# Patient Record
Sex: Female | Born: 1990 | Race: Black or African American | Marital: Single | Smoking: Never smoker
Health system: Southern US, Community
[De-identification: ages and names within clinical notes are randomized; demographics above are authoritative.]

## PROBLEM LIST (undated history)

## (undated) DIAGNOSIS — Z789 Other specified health status: Secondary | ICD-10-CM

## (undated) HISTORY — PX: WISDOM TOOTH EXTRACTION: SHX21

---

## 2019-02-08 HISTORY — PX: LIPOSUCTION: SHX10

## 2020-02-08 NOTE — L&D Delivery Note (Addendum)
Delivery Summary for [facility]  Labor Events:   Preterm labor: No data found  Rupture date: 01/09/2021  Rupture time: 10:37 AM  Rupture type: Artificial Intact  Fluid Color: Light Meconium  Induction: No data found  Augmentation: No data found  Complications: No data found  Cervical ripening: No data found No data found   No data found     Delivery:   Episiotomy: No data found  Lacerations: No data found  Repair suture: No data found  Repair # of packets: No data found  Blood loss (ml): 790   Information for the patient's newborn:  Chai, Verdejo [[id number]]   Delivery 01/09/2021 2:47 PM by  Vaginal, Spontaneous Sex:  female Gestational Age: [redacted]w[redacted]d Delivery Clinician:   Living?:         APGARS  One minute Five minutes Ten minutes  Skin color:        Heart rate:        Grimace:        Muscle tone:        Breathing:        Totals: 9  9      Presentation/position:      Resuscitation:   Cord information:    Disposition of cord blood:     Blood gases sent?  Complications:   Placenta: Delivered:       appearance Newborn Measurements: Weight: 6 lb 13 oz (3090 g)  Height: 20.47"  Head circumference:    Chest circumference:    Other providers:    Additional  information: Forceps:   Vacuum:   Breech:   Observed anomalies       Delivery Note At 2:47 PM a viable and healthy female "Colon Branch" was delivered via Vaginal, Spontaneous (Compound Presentation: Left Occiput Anterior).  APGAR: 9, 9; weight 6 lb 13 oz (3090 g).   Placenta status: Spontaneous, Intact.  Cord: 3 vessels with the following complications: None.  Cord pH: not obtained.  Delayed cord clamping observed.   Anesthesia: Epidural Episiotomy: None Lacerations: 1st degree perineal, hemostatic Suture Repair:  None Est. Blood Loss (mL):  790.  800 mcg of Cytotec administered rectally.  Mom to postpartum.  Baby to Couplet care / Skin to Skin.   Hildred Laser, MD 01/09/2021, 3:08 PM

## 2020-05-07 ENCOUNTER — Encounter: Payer: Self-pay | Admitting: Obstetrics and Gynecology

## 2020-05-14 ENCOUNTER — Ambulatory Visit (INDEPENDENT_AMBULATORY_CARE_PROVIDER_SITE_OTHER): Payer: BC Managed Care – PPO | Admitting: Obstetrics and Gynecology

## 2020-05-14 ENCOUNTER — Encounter: Payer: Self-pay | Admitting: Obstetrics and Gynecology

## 2020-05-14 ENCOUNTER — Other Ambulatory Visit: Payer: Self-pay

## 2020-05-14 VITALS — BP 113/80 | HR 90 | Ht 64.0 in | Wt 199.4 lb

## 2020-05-14 DIAGNOSIS — Z3200 Encounter for pregnancy test, result unknown: Secondary | ICD-10-CM | POA: Diagnosis not present

## 2020-05-14 DIAGNOSIS — N912 Amenorrhea, unspecified: Secondary | ICD-10-CM | POA: Diagnosis not present

## 2020-05-14 LAB — POCT URINE PREGNANCY: Preg Test, Ur: POSITIVE — AB

## 2020-05-14 NOTE — Progress Notes (Signed)
HPI:      [name] is a 30 y.o. G1P0 who LMP was Patient's last menstrual period was 04/10/2020.  Subjective:   She presents today because she has missed a menses and believes she may be pregnant.  She has done home pregnancy test which were positive.  Based on LMP she is approximately 4 weeks estimated gestational age.  She was not specifically preventing pregnancy but not attempted either.  She is currently taking prenatal vitamins.  She has some nausea without vomiting. She plans to breast-feed this baby. She is fully vaccinated and boosted for Covid    Hx: The following portions of the patient's history were reviewed and updated as appropriate:             She  has no past medical history on file. She does not have a problem list on file. She  has a past surgical history that includes Liposuction (2021). Her family history includes Diabetes in her maternal grandfather; Hypertension in her mother. She  reports that she has never smoked. She has never used smokeless tobacco. She reports previous alcohol use. She reports current drug use. Drug: Marijuana. She has a current medication list which includes the following prescription(s): multivitamin-prenatal. She has No Known Allergies.       Review of Systems:  Review of Systems  Constitutional: Denied constitutional symptoms, night sweats, recent illness, fatigue, fever, insomnia and weight loss.  Eyes: Denied eye symptoms, eye pain, photophobia, vision change and visual disturbance.  Ears/Nose/Throat/Neck: Denied ear, nose, throat or neck symptoms, hearing loss, nasal discharge, sinus congestion and sore throat.  Cardiovascular: Denied cardiovascular symptoms, arrhythmia, chest pain/pressure, edema, exercise intolerance, orthopnea and palpitations.  Respiratory: Denied pulmonary symptoms, asthma, pleuritic pain, productive sputum, cough, dyspnea and wheezing.  Gastrointestinal: Denied, gastro-esophageal reflux, melena, nausea  and vomiting.  Genitourinary: Denied genitourinary symptoms including symptomatic vaginal discharge, pelvic relaxation issues, and urinary complaints.  Musculoskeletal: Denied musculoskeletal symptoms, stiffness, swelling, muscle weakness and myalgia.  Dermatologic: Denied dermatology symptoms, rash and scar.  Neurologic: Denied neurology symptoms, dizziness, headache, neck pain and syncope.  Psychiatric: Denied psychiatric symptoms, anxiety and depression.  Endocrine: Denied endocrine symptoms including hot flashes and night sweats.   Meds:   Current Outpatient Medications on File Prior to Visit  Medication Sig Dispense Refill  . Prenatal Vit-Fe Fumarate-FA (MULTIVITAMIN-PRENATAL) 27-0.8 MG TABS tablet Take 1 tablet by mouth daily at 12 noon.     No current facility-administered medications on file prior to visit.          Objective:     Vitals:   05/14/20 1115  BP: 113/80  Pulse: 90   Filed Weights   05/14/20 1115  Weight: 199 lb 6.4 oz (90.4 kg)              Urinary beta-hCG positive  Assessment:    G1P0 There are no problems to display for this patient.    1. Possible pregnancy, not confirmed   2. Amenorrhea        Plan:            Prenatal Plan 1.  The patient was given prenatal literature. 2.  She was continued on prenatal vitamins. 3.  A prenatal lab panel to be drawn at nurse visit. 4.  An ultrasound was ordered to better determine an EDC. 5.  A nurse visit was scheduled. 6.  Genetic testing and testing for other inheritable conditions discussed in detail. She will decide in the future whether to  have these labs performed. 7.  A general overview of pregnancy testing, visit schedule, ultrasound schedule, and prenatal care was discussed. 8.  COVID and its risks associated with pregnancy, prevention by limiting exposure and use of masks, as well as the risks and benefits of vaccination during pregnancy were discussed in detail.  [facility] policy regarding  office and hospital visitation and testing was explained. 9.  Benefits of breast-feeding discussed in detail including both maternal and infant benefits. Ready Set Baby website discussed.   Orders Orders Placed This Encounter  Procedures  . US OB Comp Less 14 Wks    No orders of the defined types were placed in this encounter.     F/U  Return in about 8 weeks (around 07/09/2020). I spent 33 minutes involved in the care of this patient preparing to see the patient by obtaining and reviewing her medical history (including labs, imaging tests and prior procedures), documenting clinical information in the electronic health record (EHR), counseling and coordinating care plans, writing and sending prescriptions, ordering tests or procedures and directly communicating with the patient by discussing pertinent items from her history and physical exam as well as detailing my assessment and plan as noted above so that she has an informed understanding.  All of her questions were answered.  Elonda Husky, M.D. 05/14/2020 11:51 AM

## 2020-06-19 ENCOUNTER — Ambulatory Visit (INDEPENDENT_AMBULATORY_CARE_PROVIDER_SITE_OTHER): Payer: BC Managed Care – PPO | Admitting: Surgical

## 2020-06-19 ENCOUNTER — Other Ambulatory Visit: Payer: Self-pay

## 2020-06-19 VITALS — BP 123/84 | HR 73 | Ht 64.0 in | Wt 204.3 lb

## 2020-06-19 DIAGNOSIS — Z3491 Encounter for supervision of normal pregnancy, unspecified, first trimester: Secondary | ICD-10-CM

## 2020-06-19 LAB — OB RESULTS CONSOLE GC/CHLAMYDIA: Gonorrhea: NEGATIVE

## 2020-06-19 LAB — OB RESULTS CONSOLE VARICELLA ZOSTER ANTIBODY, IGG: Varicella: IMMUNE

## 2020-06-19 NOTE — Progress Notes (Signed)
Amy Oconnor presents for NOB nurse interview visit. Pregnancy confirmation done 05/14/2020. G1. P0. Pregnancy education material explained and given. 0 cats in home. NOB labs ordered. TSH/HbgA1c ordered due to BMI 30 or greater. Sickle cell ordered due to patient's race. HIV labs and drug screen were explained and ordered. PNV encouraged. Genetic screening options discussed. Genetic testing: Unsure. Patient may discuss with the provider. Patient to follow up with provider on 07/09/2020 for NOB physical.   FMLA and drug screen forms signed. All questions answered for patient. Patient has had Covid vaccine and Booster. She will bring card next visit so we can document.

## 2020-06-19 NOTE — Patient Instructions (Signed)
Common Medications Safe in Pregnancy  Acne:      Constipation:  Benzoyl Peroxide     Colace  Clindamycin      Dulcolax Suppository  Topica Erythromycin     Fibercon  Salicylic Acid      Metamucil         Miralax AVOID:        Senakot   Accutane    Cough:  Retin-A       Cough Drops  Tetracycline      Phenergan w/ Codeine if Rx  Minocycline      Robitussin (Plain & DM)  Antibiotics:     Crabs/Lice:  Ceclor       RID  Cephalosporins    AVOID:  E-Mycins      Kwell  Keflex  Macrobid/Macrodantin   Diarrhea:  Penicillin      Kao-Pectate  Zithromax      Imodium AD         PUSH FLUIDS AVOID:       Cipro     Fever:  Tetracycline      Tylenol (Regular or Extra  Minocycline       Strength)  Levaquin      Extra Strength-Do not          Exceed 8 tabs/24 hrs Caffeine:        <200mg/day (equiv. To 1 cup of coffee or  approx. 3 12 oz sodas)         Gas: Cold/Hayfever:       Gas-X  Benadryl      Mylicon  Claritin       Phazyme  **Claritin-D        Chlor-Trimeton    Headaches:  Dimetapp      ASA-Free Excedrin  Drixoral-Non-Drowsy     Cold Compress  Mucinex (Guaifenasin)     Tylenol (Regular or Extra  Sudafed/Sudafed-12 Hour     Strength)  **Sudafed PE Pseudoephedrine   Tylenol Cold & Sinus     Vicks Vapor Rub  Zyrtec  **AVOID if Problems With Blood Pressure         Heartburn: Avoid lying down for at least 1 hour after meals  Aciphex      Maalox     Rash:  Milk of Magnesia     Benadryl    Mylanta       1% Hydrocortisone Cream  Pepcid  Pepcid Complete   Sleep Aids:  Prevacid      Ambien   Prilosec       Benadryl  Rolaids       Chamomile Tea  Tums (Limit 4/day)     Unisom         Tylenol PM         Warm milk-add vanilla or  Hemorrhoids:       Sugar for taste  Anusol/Anusol H.C.  (RX: Analapram 2.5%)  Sugar Substitutes:  Hydrocortisone OTC     Ok in moderation  Preparation H      Tucks        Vaseline lotion applied to tissue with  wiping    Herpes:     Throat:  Acyclovir      Oragel  Famvir  Valtrex     Vaccines:         Flu Shot Leg Cramps:       *Gardasil  Benadryl      Hepatitis A         Hepatitis B Nasal Spray:         Pneumovax  Saline Nasal Spray     Polio Booster         Tetanus Nausea:       Tuberculosis test or PPD  Vitamin B6 25 mg TID   AVOID:    Dramamine      *Gardasil  Emetrol       Live Poliovirus  Ginger Root 250 mg QID    MMR (measles, mumps &  High Complex Carbs @ Bedtime    rebella)  Sea Bands-Accupressure    Varicella (Chickenpox)  Unisom 1/2 tab TID     *No known complications           If received before Pain:         Known pregnancy;   Darvocet       Resume series after  Lortab        Delivery  Percocet    Yeast:   Tramadol      Femstat  Tylenol 3      Gyne-lotrimin  Ultram       Monistat  Vicodin           MISC:         All Sunscreens           Hair Coloring/highlights          Insect Repellant's          (Including DEET)         Mystic Tans    https://www.cdc.gov/pregnancy/infections.html">  First Trimester of Pregnancy  The first trimester of pregnancy starts on the first day of your last menstrual period until the end of week 12. This is also called months 1 through 3 of pregnancy. Body changes during your first trimester Your body goes through many changes during pregnancy. The changes usually return to normal after your baby is born. Physical changes  You may gain or lose weight.  Your breasts may grow larger and hurt. The area around your nipples may get darker.  Dark spots or blotches may develop on your face.  You may have changes in your hair. Health changes  You may feel like you might vomit (nauseous), and you may vomit.  You may have heartburn.  You may have headaches.  You may have trouble pooping (constipation).  Your gums may bleed. Other changes  You may get tired easily.  You may pee (urinate) more often.  Your menstrual periods will  stop.  You may not feel hungry.  You may want to eat certain kinds of food.  You may have changes in your emotions from day to day.  You may have more dreams. Follow these instructions at home: Medicines  Take over-the-counter and prescription medicines only as told by your doctor. Some medicines are not safe during pregnancy.  Take a prenatal vitamin that contains at least 600 micrograms (mcg) of folic acid. Eating and drinking  Eat healthy meals that include: ? Fresh fruits and vegetables. ? Whole grains. ? Good sources of protein, such as meat, eggs, or tofu. ? Low-fat dairy products.  Avoid raw meat and unpasteurized juice, milk, and cheese.  If you feel like you may vomit, or you vomit: ? Eat 4 or 5 small meals a day instead of 3 large meals. ? Try eating a few soda crackers. ? Drink liquids between meals instead of during meals.  You may need to take these actions to prevent or treat trouble pooping: ? Drink enough fluids to keep your pee (urine) pale yellow. ?   Eat foods that are high in fiber. These include beans, whole grains, and fresh fruits and vegetables. ? Limit foods that are high in fat and sugar. These include fried or sweet foods. Activity  Exercise only as told by your doctor. Most people can do their usual exercise routine during pregnancy.  Stop exercising if you have cramps or pain in your lower belly (abdomen) or low back.  Do not exercise if it is too hot or too humid, or if you are in a place of great height (high altitude).  Avoid heavy lifting.  If you choose to, you may have sex unless your doctor tells you not to. Relieving pain and discomfort  Wear a good support bra if your breasts are sore.  Rest with your legs raised (elevated) if you have leg cramps or low back pain.  If you have bulging veins (varicose veins) in your legs: ? Wear support hose as told by your doctor. ? Raise your feet for 15 minutes, 3-4 times a day. ? Limit salt  in your food. Safety  Wear your seat belt at all times when you are in a car.  Talk with your doctor if someone is hurting you or yelling at you.  Talk with your doctor if you are feeling sad or have thoughts of hurting yourself. Lifestyle  Do not use hot tubs, steam rooms, or saunas.  Do not douche. Do not use tampons or scented sanitary pads.  Do not use herbal medicines, illegal drugs, or medicines that are not approved by your doctor. Do not drink alcohol.  Do not smoke or use any products that contain nicotine or tobacco. If you need help quitting, ask your doctor.  Avoid cat litter boxes and soil that is used by cats. These carry germs that can cause harm to the baby and can cause a loss of your baby by miscarriage or stillbirth. General instructions  Keep all follow-up visits. This is important.  Ask for help if you need counseling or if you need help with nutrition. Your doctor can give you advice or tell you where to go for help.  Visit your dentist. At home, brush your teeth with a soft toothbrush. Floss gently.  Write down your questions. Take them to your prenatal visits. Where to find more information  American Pregnancy Association: americanpregnancy.org  American College of Obstetricians and Gynecologists: www.acog.org  Office on Women's Health: womenshealth.gov/pregnancy Contact a doctor if:  You are dizzy.  You have a fever.  You have mild cramps or pressure in your lower belly.  You have a nagging pain in your belly area.  You continue to feel like you may vomit, you vomit, or you have watery poop (diarrhea) for 24 hours or longer.  You have a bad-smelling fluid coming from your vagina.  You have pain when you pee.  You are exposed to a disease that spreads from person to person, such as chickenpox, measles, Zika virus, HIV, or hepatitis. Get help right away if:  You have spotting or bleeding from your vagina.  You have very bad belly cramping  or pain.  You have shortness of breath or chest pain.  You have any kind of injury, such as from a fall or a car crash.  You have new or increased pain, swelling, or redness in an arm or leg. Summary  The first trimester of pregnancy starts on the first day of your last menstrual period until the end of week 12 (months 1 through   3).  Eat 4 or 5 small meals a day instead of 3 large meals.  Do not smoke or use any products that contain nicotine or tobacco. If you need help quitting, ask your doctor.  Keep all follow-up visits. This information is not intended to replace advice given to you by your health care provider. Make sure you discuss any questions you have with your health care provider. Document Revised: 07/03/2019 Document Reviewed: 05/09/2019 Elsevier Patient Education  2021 Elsevier Inc.  

## 2020-06-20 LAB — MICROSCOPIC EXAMINATION
Casts: NONE SEEN /lpf
Epithelial Cells (non renal): >10 /hpf — AB (ref 0–10)

## 2020-06-20 LAB — URINALYSIS, ROUTINE W REFLEX MICROSCOPIC
Bilirubin, UA: NEGATIVE
Glucose, UA: NEGATIVE
Nitrite, UA: NEGATIVE
RBC, UA: NEGATIVE
Specific Gravity, UA: 1.022 (ref 1.005–1.030)
Urobilinogen, Ur: 0.2 mg/dL (ref 0.2–1.0)
pH, UA: 7 (ref 5.0–7.5)

## 2020-06-21 LAB — CULTURE, OB URINE

## 2020-06-21 LAB — GC/CHLAMYDIA PROBE AMP
Chlamydia trachomatis, NAA: NEGATIVE
Neisseria Gonorrhoeae by PCR: NEGATIVE

## 2020-06-21 LAB — URINE CULTURE, OB REFLEX

## 2020-06-22 ENCOUNTER — Ambulatory Visit: Payer: BC Managed Care – PPO

## 2020-06-23 LAB — CBC WITH DIFFERENTIAL/PLATELET
Basophils Absolute: 0 10*3/uL (ref 0.0–0.2)
Basos: 1 %
EOS (ABSOLUTE): 0.1 10*3/uL (ref 0.0–0.4)
Eos: 1 %
Hematocrit: 38.5 % (ref 34.0–46.6)
Hemoglobin: 12.7 g/dL (ref 11.1–15.9)
Immature Grans (Abs): 0 10*3/uL (ref 0.0–0.1)
Immature Granulocytes: 0 %
Lymphocytes Absolute: 1.8 10*3/uL (ref 0.7–3.1)
Lymphs: 36 %
MCH: 28.3 pg (ref 26.6–33.0)
MCHC: 33 g/dL (ref 31.5–35.7)
MCV: 86 fL (ref 79–97)
Monocytes Absolute: 0.3 10*3/uL (ref 0.1–0.9)
Monocytes: 6 %
Neutrophils Absolute: 2.7 10*3/uL (ref 1.4–7.0)
Neutrophils: 56 %
Platelets: 264 10*3/uL (ref 150–450)
RBC: 4.49 x10E6/uL (ref 3.77–5.28)
RDW: 13.8 % (ref 11.7–15.4)
WBC: 4.9 10*3/uL (ref 3.4–10.8)

## 2020-06-23 LAB — VIRAL HEPATITIS HBV, HCV
HCV Ab: <0.1 s/co ratio (ref 0.0–0.9)
Hep B Core Total Ab: NEGATIVE
Hep B Surface Ab, Qual: NONREACTIVE
Hepatitis B Surface Ag: NEGATIVE

## 2020-06-23 LAB — MONITOR DRUG PROFILE 14(MW)
Amphetamine Scrn, Ur: NEGATIVE ng/mL
BARBITURATE SCREEN URINE: NEGATIVE ng/mL
BENZODIAZEPINE SCREEN, URINE: NEGATIVE ng/mL
Buprenorphine, Urine: NEGATIVE ng/mL
CANNABINOIDS UR QL SCN: NEGATIVE ng/mL
Cocaine (Metab) Scrn, Ur: NEGATIVE ng/mL
Creatinine(Crt), U: 219.9 mg/dL (ref 20.0–300.0)
Fentanyl, Urine: NEGATIVE pg/mL
Meperidine Screen, Urine: NEGATIVE ng/mL
Methadone Screen, Urine: NEGATIVE ng/mL
OXYCODONE+OXYMORPHONE UR QL SCN: NEGATIVE ng/mL
Opiate Scrn, Ur: NEGATIVE ng/mL
Ph of Urine: 7.1 (ref 4.5–8.9)
Phencyclidine Qn, Ur: NEGATIVE ng/mL
Propoxyphene Scrn, Ur: NEGATIVE ng/mL
SPECIFIC GRAVITY: 1.02
Tramadol Screen, Urine: NEGATIVE ng/mL

## 2020-06-23 LAB — HEMOGLOBIN A1C
Est. average glucose Bld gHb Est-mCnc: 114 mg/dL
Hgb A1c MFr Bld: 5.6 % (ref 4.8–5.6)

## 2020-06-23 LAB — RUBELLA SCREEN: Rubella Antibodies, IGG: 1.3 index (ref >0.99)

## 2020-06-23 LAB — HIV ANTIBODY (ROUTINE TESTING W REFLEX): HIV Screen 4th Generation wRfx: NONREACTIVE

## 2020-06-23 LAB — RPR: RPR Ser Ql: NONREACTIVE

## 2020-06-23 LAB — TSH: TSH: 1.19 u[IU]/mL (ref 0.450–4.500)

## 2020-06-23 LAB — ABO AND RH: Rh Factor: POSITIVE

## 2020-06-23 LAB — VARICELLA ZOSTER ANTIBODY, IGG: Varicella zoster IgG: 3372 index (ref >165)

## 2020-06-23 LAB — ANTIBODY SCREEN: Antibody Screen: NEGATIVE

## 2020-06-23 LAB — HCV INTERPRETATION

## 2020-06-23 LAB — HGB SOLU + RFLX FRAC: Sickle Solubility Test - HGBRFX: NEGATIVE

## 2020-06-24 LAB — MATERNIT21  PLUS CORE+ESS+SCA, BLOOD

## 2020-06-29 ENCOUNTER — Ambulatory Visit: Admission: RE | Admit: 2020-06-29 | Payer: BC Managed Care – PPO | Source: Ambulatory Visit

## 2020-07-08 ENCOUNTER — Ambulatory Visit: Payer: BC Managed Care – PPO

## 2020-07-08 ENCOUNTER — Other Ambulatory Visit: Payer: Self-pay

## 2020-07-08 ENCOUNTER — Ambulatory Visit
Admission: RE | Admit: 2020-07-08 | Discharge: 2020-07-08 | Disposition: A | Discharge status: Home / Self Care | Payer: BC Managed Care – PPO | Source: Ambulatory Visit | Attending: Obstetrics and Gynecology | Admitting: Obstetrics and Gynecology

## 2020-07-08 ENCOUNTER — Other Ambulatory Visit: Payer: Self-pay | Admitting: Obstetrics and Gynecology

## 2020-07-08 DIAGNOSIS — Z3200 Encounter for pregnancy test, result unknown: Secondary | ICD-10-CM | POA: Diagnosis not present

## 2020-07-08 DIAGNOSIS — N912 Amenorrhea, unspecified: Secondary | ICD-10-CM

## 2020-07-09 ENCOUNTER — Other Ambulatory Visit: Payer: Self-pay

## 2020-07-09 ENCOUNTER — Encounter: Payer: Self-pay | Admitting: Obstetrics and Gynecology

## 2020-07-09 ENCOUNTER — Ambulatory Visit (INDEPENDENT_AMBULATORY_CARE_PROVIDER_SITE_OTHER): Payer: BC Managed Care – PPO | Admitting: Obstetrics and Gynecology

## 2020-07-09 VITALS — BP 146/80 | HR 81 | Wt 205.3 lb

## 2020-07-09 DIAGNOSIS — Z3402 Encounter for supervision of normal first pregnancy, second trimester: Secondary | ICD-10-CM

## 2020-07-09 DIAGNOSIS — Z3A14 14 weeks gestation of pregnancy: Secondary | ICD-10-CM

## 2020-07-09 LAB — POCT URINALYSIS DIPSTICK OB
Bilirubin, UA: NEGATIVE
Glucose, UA: NEGATIVE
Ketones, UA: NEGATIVE
Nitrite, UA: NEGATIVE
POC,PROTEIN,UA: NEGATIVE
Spec Grav, UA: <=1.005 — AB (ref 1.010–1.025)
Urobilinogen, UA: 0.2 E.U./dL
pH, UA: 7.5 (ref 5.0–8.0)

## 2020-07-09 NOTE — Progress Notes (Signed)
NOB: No complaints.  Taking vitamins as directed.  Normal MaterniT 21.  aFP next visit.  Physical examination General NAD, Conversant  HEENT Atraumatic; Op clear with mmm.  Normo-cephalic. Pupils reactive. Anicteric sclerae  Thyroid/Neck Smooth without nodularity or enlargement. Normal ROM.  Neck Supple.  Skin No rashes, lesions or ulceration. Normal palpated skin turgor. No nodularity.  Breasts: No masses or discharge.  Symmetric.  No axillary adenopathy.  Lungs: Clear to auscultation.No rales or wheezes. Normal Respiratory effort, no retractions.  Heart: NSR.  No murmurs or rubs appreciated. No periferal edema  Abdomen: Soft.  Non-tender.  No masses.  No HSM. No hernia  Extremities: Moves all appropriately.  Normal ROM for age. No lymphadenopathy.  Neuro: Oriented to PPT.  Normal mood. Normal affect.     Pelvic:   Vulva: Normal appearance.  No lesions.  Vagina: No lesions or abnormalities noted.  Support: Normal pelvic support.  Urethra No masses tenderness or scarring.  Meatus Normal size without lesions or prolapse.  Cervix: Normal appearance.  No lesions.  Anus: Normal exam.  No lesions.  Perineum: Normal exam.  No lesions.        Bimanual   Adnexae: No masses.  Non-tender to palpation.  Uterus: Enlarged. 14wks POS FHTs  Non-tender.  Mobile.  AV.  Adnexae: No masses.  Non-tender to palpation.  Cul-de-sac: Negative for abnormality.  Adnexae: No masses.  Non-tender to palpation.         Pelvimetry   Diagonal: Reached.  Spines: Average.  Sacrum: Concave.  Pubic Arch: Normal.

## 2020-07-29 NOTE — Progress Notes (Signed)
OB-Pt present for routine prenatal care. Pt stated that she is doing well.  

## 2020-07-29 NOTE — Patient Instructions (Signed)
WHAT OB PATIENTS CAN EXPECT  Confirmation of pregnancy and ultrasound ordered if medically indicated-[redacted] weeks gestation New OB (NOB) intake with nurse and New OB (NOB) labs- [redacted] weeks gestation New OB (NOB) physical examination with provider- 11/[redacted] weeks gestation Flu vaccine-[redacted] weeks gestation Anatomy scan-[redacted] weeks gestation Glucose tolerance test, blood work to test for anemia, T-dap vaccine-[redacted] weeks gestation Vaginal swabs/cultures-STD/Group B strep-[redacted] weeks gestation Appointments every 4 weeks until 28 weeks Every 2 weeks from 28 weeks until 36 weeks Weekly visits from 36 weeks until delivery  Common Medications Safe in Pregnancy  Acne:      Constipation:  Benzoyl Peroxide     Colace  Clindamycin      Dulcolax Suppository  Topica Erythromycin     Fibercon  Salicylic Acid      Metamucil         Miralax AVOID:        Senakot   Accutane    Cough:  Retin-A       Cough Drops  Tetracycline      Phenergan w/ Codeine if Rx  Minocycline      Robitussin (Plain & DM)  Antibiotics:     Crabs/Lice:  Ceclor       RID  Cephalosporins    AVOID:  E-Mycins      Kwell  Keflex  Macrobid/Macrodantin   Diarrhea:  Penicillin      Kao-Pectate  Zithromax      Imodium AD         PUSH FLUIDS AVOID:       Cipro     Fever:  Tetracycline      Tylenol (Regular or Extra  Minocycline       Strength)  Levaquin      Extra Strength-Do not          Exceed 8 tabs/24 hrs Caffeine:        <200mg/day (equiv. To 1 cup of coffee or  approx. 3 12 oz sodas)         Gas: Cold/Hayfever:       Gas-X  Benadryl      Mylicon  Claritin       Phazyme  **Claritin-D        Chlor-Trimeton    Headaches:  Dimetapp      ASA-Free Excedrin  Drixoral-Non-Drowsy     Cold Compress  Mucinex (Guaifenasin)     Tylenol (Regular or Extra  Sudafed/Sudafed-12 Hour     Strength)  **Sudafed PE Pseudoephedrine   Tylenol Cold & Sinus     Vicks Vapor Rub  Zyrtec  **AVOID if Problems With Blood Pressure         Heartburn:  Avoid lying down for at least 1 hour after meals  Aciphex      Maalox     Rash:  Milk of Magnesia     Benadryl    Mylanta       1% Hydrocortisone Cream  Pepcid  Pepcid Complete   Sleep Aids:  Prevacid      Ambien   Prilosec       Benadryl  Rolaids       Chamomile Tea  Tums (Limit 4/day)     Unisom         Tylenol PM         Warm milk-add vanilla or  Hemorrhoids:       Sugar for taste  Anusol/Anusol H.C.  (RX: Analapram 2.5%)  Sugar Substitutes:  Hydrocortisone OTC       Ok in moderation  Preparation H      Tucks        Vaseline lotion applied to tissue with wiping    Herpes:     Throat:  Acyclovir      Oragel  Famvir  Valtrex     Vaccines:         Flu Shot Leg Cramps:       *Gardasil  Benadryl      Hepatitis A         Hepatitis B Nasal Spray:       Pneumovax  Saline Nasal Spray     Polio Booster         Tetanus Nausea:       Tuberculosis test or PPD  Vitamin B6 25 mg TID   AVOID:    Dramamine      *Gardasil  Emetrol       Live Poliovirus  Ginger Root 250 mg QID    MMR (measles, mumps &  High Complex Carbs @ Bedtime    rebella)  Sea Bands-Accupressure    Varicella (Chickenpox)  Unisom 1/2 tab TID     *No known complications           If received before Pain:         Known pregnancy;   Darvocet       Resume series after  Lortab        Delivery  Percocet    Yeast:   Tramadol      Femstat  Tylenol 3      Gyne-lotrimin  Ultram       Monistat  Vicodin           MISC:         All Sunscreens           Hair Coloring/highlights          Insect Repellant's          (Including DEET)         Mystic Tans  

## 2020-07-30 ENCOUNTER — Encounter: Payer: Self-pay | Admitting: Obstetrics and Gynecology

## 2020-07-30 ENCOUNTER — Other Ambulatory Visit: Payer: Self-pay

## 2020-07-30 ENCOUNTER — Ambulatory Visit (INDEPENDENT_AMBULATORY_CARE_PROVIDER_SITE_OTHER): Payer: BC Managed Care – PPO | Admitting: Obstetrics and Gynecology

## 2020-07-30 VITALS — BP 114/76 | HR 88 | Wt 210.4 lb

## 2020-07-30 DIAGNOSIS — Z1379 Encounter for other screening for genetic and chromosomal anomalies: Secondary | ICD-10-CM

## 2020-07-30 DIAGNOSIS — Z3402 Encounter for supervision of normal first pregnancy, second trimester: Secondary | ICD-10-CM

## 2020-07-30 DIAGNOSIS — Z3A17 17 weeks gestation of pregnancy: Secondary | ICD-10-CM

## 2020-07-30 LAB — POCT URINALYSIS DIPSTICK OB
Bilirubin, UA: NEGATIVE
Blood, UA: NEGATIVE
Glucose, UA: NEGATIVE
Ketones, UA: NEGATIVE
Leukocytes, UA: NEGATIVE
Nitrite, UA: NEGATIVE
POC,PROTEIN,UA: NEGATIVE
Spec Grav, UA: <=1.005 — AB (ref 1.010–1.025)
Urobilinogen, UA: 0.2 E.U./dL
pH, UA: 8 (ref 5.0–8.0)

## 2020-07-30 NOTE — Progress Notes (Addendum)
ROB: Doing well, no complaints.  Has anatomy scan scheduled. Normal MaterniT21, for AFP today. Plans to breastfeed. Elevated BP noted last visit but normal today.  Will continue to monitor during the pregnancy. RTC in 4 weeks.

## 2020-08-07 LAB — AFP, SERUM, OPEN SPINA BIFIDA
AFP MoM: 1.6
AFP Value: 56.9 ng/mL
Gest. Age on Collection Date: 17 weeks
Maternal Age At EDD: 28.8 yr
OSBR Risk 1 IN: 4200
Test Results:: NEGATIVE
Weight: 210 [lb_av]

## 2020-08-13 ENCOUNTER — Other Ambulatory Visit: Payer: Self-pay

## 2020-08-13 ENCOUNTER — Ambulatory Visit
Admission: RE | Admit: 2020-08-13 | Discharge: 2020-08-13 | Disposition: A | Discharge status: Home / Self Care | Payer: BC Managed Care – PPO | Source: Ambulatory Visit | Attending: Obstetrics and Gynecology | Admitting: Obstetrics and Gynecology

## 2020-08-13 DIAGNOSIS — Z369 Encounter for antenatal screening, unspecified: Secondary | ICD-10-CM | POA: Diagnosis not present

## 2020-08-13 DIAGNOSIS — Z3A14 14 weeks gestation of pregnancy: Secondary | ICD-10-CM

## 2020-08-27 ENCOUNTER — Ambulatory Visit (INDEPENDENT_AMBULATORY_CARE_PROVIDER_SITE_OTHER): Payer: BC Managed Care – PPO | Admitting: Obstetrics and Gynecology

## 2020-08-27 ENCOUNTER — Other Ambulatory Visit: Payer: Self-pay

## 2020-08-27 ENCOUNTER — Encounter: Payer: Self-pay | Admitting: Obstetrics and Gynecology

## 2020-08-27 VITALS — BP 115/75 | HR 78 | Wt 213.4 lb

## 2020-08-27 DIAGNOSIS — Z3A21 21 weeks gestation of pregnancy: Secondary | ICD-10-CM

## 2020-08-27 DIAGNOSIS — Z3402 Encounter for supervision of normal first pregnancy, second trimester: Secondary | ICD-10-CM

## 2020-08-27 NOTE — Progress Notes (Signed)
ROB: No complaints.  Patient now feeling the baby move.  Note for work-no prolonged standing, more frequent seated breaks.  Ultrasound results discussed.

## 2020-08-31 ENCOUNTER — Telehealth: Payer: Self-pay | Admitting: Obstetrics and Gynecology

## 2020-08-31 NOTE — Telephone Encounter (Signed)
Patient called in and states she sent the following message per mychart:  I spoke with my safety department at work. They said they needed something more quantitative. Are you able to adjust the letter to say that I need a twenty minute rest break after standing for a duration of 2hrs or something more along those lines? Whatever you think is suitable to go a long with provided time for meal breaks.   Thank you.  Please advise.

## 2020-08-31 NOTE — Telephone Encounter (Signed)
LVM with patient informing her work restriction note has been sent and should be in her MyChart and to call with any other questions.

## 2020-08-31 NOTE — Telephone Encounter (Signed)
Amy Oconnor states when she was at her last appointment on 7/21, she was supposed to get a work note sent to her mychart.  Patient states she never received it.  Patient states she needs a work note stating she cannot stand for prolonged periods of time and that she needs to be allowed to get appropriate times off for meals.  Patient states she works for [employer].  Patient states she needs this note today if at all possible because she is working for the next 3 days.  Please advise.

## 2020-08-31 NOTE — Telephone Encounter (Signed)
Is it ok to adjust letter. Please advise.

## 2020-09-03 NOTE — Telephone Encounter (Signed)
Thanks. Adjustments have been made.

## 2020-10-01 ENCOUNTER — Ambulatory Visit (INDEPENDENT_AMBULATORY_CARE_PROVIDER_SITE_OTHER): Payer: BC Managed Care – PPO | Admitting: Obstetrics and Gynecology

## 2020-10-01 ENCOUNTER — Other Ambulatory Visit: Payer: Self-pay

## 2020-10-01 ENCOUNTER — Encounter: Payer: Self-pay | Admitting: Obstetrics and Gynecology

## 2020-10-01 VITALS — BP 120/81 | HR 85 | Wt 218.9 lb

## 2020-10-01 DIAGNOSIS — Z3A26 26 weeks gestation of pregnancy: Secondary | ICD-10-CM

## 2020-10-01 DIAGNOSIS — Z3402 Encounter for supervision of normal first pregnancy, second trimester: Secondary | ICD-10-CM

## 2020-10-01 LAB — POCT URINALYSIS DIPSTICK OB
Glucose, UA: NEGATIVE
Ketones, UA: NEGATIVE
Leukocytes, UA: NEGATIVE
Nitrite, UA: NEGATIVE
POC,PROTEIN,UA: NEGATIVE
Spec Grav, UA: 1.01 (ref 1.010–1.025)
Urobilinogen, UA: 0.2 E.U./dL
pH, UA: 8.5 — AB (ref 5.0–8.0)

## 2020-10-01 NOTE — Patient Instructions (Addendum)
DOULA SERVICES    Please email [place] at: [email or web address]. We do have a phone number, but it is a voicemail only, [phone]. We can then call the client back and collect info from them to ensure they qualify for no-cost doula services and match them with a doula.   You may also complete a Request Form online.  Please view the Doula Request Form ( [email or web address]) and submit it if you meet the qualifying criteria listed within the form. If you do not meet the criteria for the program to receive a doula at no cost, we have attached a list of private doulas who are credentialed with [facility] that you can connect with and retain for your birth.   Currently, our program is only for birth, but most of our doulas connect with clients and provide support in the prenatal period as well.     GESTATIONAL DIABETES TESTING FOR PREGNANCY  Pregnant women can develop a condition known as Gestational Diabetes (diabetes brought on by pregnancy) which can pose a risk to both mother and baby. A glucose tolerance test is a common type of testing for potential gestational diabetes.  There are several tests intended to identify gestational diabetes in pregnant women. The first, called the Glucose Challenge Screening, is a preliminary screening test performed between 26-28 weeks. If a woman tests positive during this screening test, the second test, called the Glucose Tolerance Test, may be performed. This test will diagnose whether diabetes exists or not by indicating whether or not the body is using glucose (a type of sugar) effectively.  The Glucose Challenge Screening is now considered to be a standard test performed during the early part of the third trimester of pregnancy.  What is the Glucose Challenge Screening Test? No preparation is required prior to the test. During the test, the mother is asked to drink a sweet liquid (glucose) and then will have blood drawn one hour  from having the drink, as blood glucose levels normally peak within one hour. No fasting is required prior to this test.  The test evaluates how your body processes sugar. A high level in your blood may indicate your body is not processing sugar effectively (positive test). If the results of this screen are positive, the woman may have the Glucose Tolerance Test performed. It is important to note that not all women who test positive for the Glucose Challenge Screening test are found to have diabetes upon further diagnosis.  What is the Glucose Tolerance Test? Prior to the taking the glucose tolerance test, your doctor will ask you to make sure and eat at least 112m of carbohydrates (about what you will get from a slice or two of bread) for three days prior to the time you will be asked to fast. You will not be permitted to eat or drink anything but sips of water for 14 hours prior to the test, so it is best to schedule the test for first thing in the morning.  Additionally, you should plan to have someone drive you to and from the test since your energy levels may be low and there is a slight possibility you may feel light-headed.  When you arrive, the technician will draw blood to measure your baseline "fasting blood glucose level". You will be asked to drink a larger volume (or more concentrated solution) of the glucose drink than was used in the initial Glucose Challenge Screening test. Your blood will be drawn and tested every hour for the next  three hours.  The following are the values that the American Diabetes Association considers to be abnormal during the Glucose Tolerance Test:  Interval Abnormal reading Fasting 95 mg/dl or higher One hour 180 mg/dl or higher Two hours 155 mg/dl or higher Three hours 140 mg/dl or higher  What if my Glucose Tolerance Test Results are Abnormal? If only one of your readings comes back abnormal, your doctor may suggest some changes to your diet and/or test  you again later in the pregnancy. If two or more of your readings come back abnormal, you'll be diagnosed with Gestational Diabetes and your doctor or midwife will talk to you about a treatment plan. Treating diabetes during pregnancy is extremely important to protect the health of both mother and baby.   Compiled using information from the following sources:  1. American Diabetes Association  https://www.diabetes.org  2. Emedicine  https://www.emedicine.com  3. [employer] of Diabetes and Digestive and Kidney Diseases     Common Medications Safe in Pregnancy  Acne:      Constipation:  Benzoyl Peroxide     Colace  Clindamycin      Dulcolax Suppository  Topica Erythromycin     Fibercon  Salicylic Acid      Metamucil         Miralax AVOID:        Senakot   Accutane    Cough:  Retin-A       Cough Drops  Tetracycline      Phenergan w/ Codeine if Rx  Minocycline      Robitussin (Plain & DM)  Antibiotics:     Crabs/Lice:  Ceclor       RID  Cephalosporins    AVOID:  E-Mycins      Kwell  Keflex  Macrobid/Macrodantin   Diarrhea:  Penicillin      Kao-Pectate  Zithromax      Imodium AD         PUSH FLUIDS AVOID:       Cipro     Fever:  Tetracycline      Tylenol (Regular or Extra  Minocycline       Strength)  Levaquin      Extra Strength-Do not          Exceed 8 tabs/24 hrs Caffeine:        <246m/day (equiv. To 1 cup of coffee or  approx. 3 12 oz sodas)         Gas: Cold/Hayfever:       Gas-X  Benadryl      Mylicon  Claritin       Phazyme  **Claritin-D        Chlor-Trimeton    Headaches:  Dimetapp      ASA-Free Excedrin  Drixoral-Non-Drowsy     Cold Compress  Mucinex (Guaifenasin)     Tylenol (Regular or Extra  Sudafed/Sudafed-12 Hour     Strength)  **Sudafed PE Pseudoephedrine   Tylenol Cold & Sinus     Vicks Vapor Rub  Zyrtec  **AVOID if Problems With Blood Pressure         Heartburn: Avoid lying down for at least 1 hour after  meals  Aciphex      Maalox     Rash:  Milk of Magnesia     Benadryl    Mylanta       1% Hydrocortisone Cream  Pepcid  Pepcid Complete   Sleep Aids:  Prevacid      Ambien   Prilosec  Benadryl  Rolaids       Chamomile Tea  Tums (Limit 4/day)     Unisom         Tylenol PM         Warm milk-add vanilla or  Hemorrhoids:       Sugar for taste  Anusol/Anusol H.C.  (RX: Analapram 2.5%)  Sugar Substitutes:  Hydrocortisone OTC     Ok in moderation  Preparation H      Tucks        Vaseline lotion applied to tissue with wiping    Herpes:     Throat:  Acyclovir      Oragel  Famvir  Valtrex     Vaccines:         Flu Shot Leg Cramps:       *Gardasil  Benadryl      Hepatitis A         Hepatitis B Nasal Spray:       Pneumovax  Saline Nasal Spray     Polio Booster         Tetanus Nausea:       Tuberculosis test or PPD  Vitamin B6 25 mg TID   AVOID:    Dramamine      *Gardasil  Emetrol       Live Poliovirus  Ginger Root 250 mg QID    MMR (measles, mumps &  High Complex Carbs @ Bedtime    rebella)  Sea Bands-Accupressure    Varicella (Chickenpox)  Unisom 1/2 tab TID     *No known complications           If received before Pain:         Known pregnancy;   Darvocet       Resume series after  Lortab        Delivery  Percocet    Yeast:   Tramadol      Femstat  Tylenol 3      Gyne-lotrimin  Ultram       Monistat  Vicodin           MISC:         All Sunscreens           Hair Coloring/highlights          Insect Repellant's          (Including DEET)         Mystic Tans

## 2020-10-01 NOTE — Progress Notes (Signed)
ROB: She has been having some discomfort that comes and goes when she urinates.

## 2020-10-01 NOTE — Progress Notes (Signed)
Reports issues with congestion since last Wednesday. Mucus changed from clear to green. Has been taking Benadryl which helped. Also encouraged use of Mucinex. Discussed labor plans, unsure of desires at this time. Notes that she has a family member who is a doula and plans to utilize at her birth. Informed of credentialing process through [facility].  RTC 2 weeks, for glucola at that time.

## 2020-10-14 ENCOUNTER — Other Ambulatory Visit: Payer: Self-pay

## 2020-10-14 DIAGNOSIS — Z3A27 27 weeks gestation of pregnancy: Secondary | ICD-10-CM

## 2020-10-14 DIAGNOSIS — Z3482 Encounter for supervision of other normal pregnancy, second trimester: Secondary | ICD-10-CM

## 2020-10-15 ENCOUNTER — Other Ambulatory Visit: Payer: Self-pay

## 2020-10-15 ENCOUNTER — Ambulatory Visit (INDEPENDENT_AMBULATORY_CARE_PROVIDER_SITE_OTHER): Payer: BC Managed Care – PPO | Admitting: Obstetrics and Gynecology

## 2020-10-15 ENCOUNTER — Other Ambulatory Visit: Payer: BC Managed Care – PPO

## 2020-10-15 ENCOUNTER — Encounter: Payer: Self-pay | Admitting: Obstetrics and Gynecology

## 2020-10-15 VITALS — BP 118/83 | HR 76 | Wt 222.4 lb

## 2020-10-15 DIAGNOSIS — Z23 Encounter for immunization: Secondary | ICD-10-CM | POA: Diagnosis not present

## 2020-10-15 DIAGNOSIS — Z3482 Encounter for supervision of other normal pregnancy, second trimester: Secondary | ICD-10-CM

## 2020-10-15 DIAGNOSIS — Z3A28 28 weeks gestation of pregnancy: Secondary | ICD-10-CM | POA: Diagnosis not present

## 2020-10-15 DIAGNOSIS — Z3402 Encounter for supervision of normal first pregnancy, second trimester: Secondary | ICD-10-CM | POA: Diagnosis not present

## 2020-10-15 LAB — POCT URINALYSIS DIPSTICK OB
Bilirubin, UA: NEGATIVE
Blood, UA: NEGATIVE
Glucose, UA: NEGATIVE
Ketones, UA: NEGATIVE
Leukocytes, UA: NEGATIVE
Nitrite, UA: NEGATIVE
POC,PROTEIN,UA: NEGATIVE
Spec Grav, UA: 1.01 (ref 1.010–1.025)
Urobilinogen, UA: 0.2 E.U./dL
pH, UA: 8 (ref 5.0–8.0)

## 2020-10-15 NOTE — Progress Notes (Signed)
ROB: Denies problems.  Patient doing very well.  To begin light duty at work. (Letter given).  GCT today.  No longer any issues with congestion.

## 2020-10-15 NOTE — Progress Notes (Signed)
ROB: She is doing well, no concerns. Pregnancy has been really smooth so far. TDAP/ BTC/ CBC/ RPR and Glucose done today.

## 2020-10-16 LAB — CBC
Hematocrit: 33.8 % — ABNORMAL LOW (ref 34.0–46.6)
Hemoglobin: 11.1 g/dL (ref 11.1–15.9)
MCH: 28 pg (ref 26.6–33.0)
MCHC: 32.8 g/dL (ref 31.5–35.7)
MCV: 85 fL (ref 79–97)
Platelets: 238 10*3/uL (ref 150–450)
RBC: 3.96 x10E6/uL (ref 3.77–5.28)
RDW: 13 % (ref 11.7–15.4)
WBC: 6.5 10*3/uL (ref 3.4–10.8)

## 2020-10-16 LAB — RPR: RPR Ser Ql: NONREACTIVE

## 2020-10-16 LAB — GLUCOSE, 1 HOUR GESTATIONAL: Gestational Diabetes Screen: 99 mg/dL (ref 65–139)

## 2020-10-28 ENCOUNTER — Encounter: Payer: BC Managed Care – PPO | Admitting: Obstetrics and Gynecology

## 2020-11-05 NOTE — Patient Instructions (Addendum)
[place], [place], [place] [address]  Phone: [phone]  [facility] (second location)  [address] [place], [place] [address]  Phone: [phone]  [facility] [place], [place], [place] [address] Phone: [phone]  [facility]  [address], [place], [place] [address]  Phone: [phone]     Common Medications Safe in Pregnancy  Acne:      Constipation:  Benzoyl Peroxide     Colace  Clindamycin      Dulcolax Suppository  Topica Erythromycin     Fibercon  Salicylic Acid      Metamucil         Miralax AVOID:        Senakot   Accutane    Cough:  Retin-A       Cough Drops  Tetracycline      Phenergan w/ Codeine if Rx  Minocycline      Robitussin (Plain & DM)  Antibiotics:     Crabs/Lice:  Ceclor       RID  Cephalosporins    AVOID:  E-Mycins      Kwell  Keflex  Macrobid/Macrodantin   Diarrhea:  Penicillin      Kao-Pectate  Zithromax      Imodium AD         PUSH FLUIDS AVOID:       Cipro     Fever:  Tetracycline      Tylenol (Regular or Extra  Minocycline       Strength)  Levaquin      Extra Strength-Do not          Exceed 8 tabs/24 hrs Caffeine:        <254m/day (equiv. To 1 cup of coffee or  approx. 3 12 oz sodas)         Gas: Cold/Hayfever:       Gas-X  Benadryl      Mylicon  Claritin       Phazyme  **Claritin-D        Chlor-Trimeton    Headaches:  Dimetapp      ASA-Free Excedrin  Drixoral-Non-Drowsy     Cold Compress  Mucinex (Guaifenasin)     Tylenol (Regular or Extra  Sudafed/Sudafed-12 Hour     Strength)  **Sudafed PE Pseudoephedrine   Tylenol Cold & Sinus     Vicks Vapor Rub  Zyrtec  **AVOID if Problems With Blood Pressure         Heartburn: Avoid lying down for at least 1 hour after meals  Aciphex      Maalox     Rash:  Milk of Magnesia     Benadryl    Mylanta       1% Hydrocortisone Cream  Pepcid  Pepcid  Complete   Sleep Aids:  Prevacid      Ambien   Prilosec       Benadryl  Rolaids       Chamomile Tea  Tums (Limit 4/day)     Unisom         Tylenol PM         Warm milk-add vanilla or  Hemorrhoids:       Sugar for taste  Anusol/Anusol H.C.  (RX: Analapram 2.5%)  Sugar Substitutes:  Hydrocortisone OTC     Ok in moderation  Preparation H      Tucks        Vaseline lotion applied to  tissue with wiping    Herpes:     Throat:  Acyclovir      Oragel  Famvir  Valtrex     Vaccines:         Flu Shot Leg Cramps:       *Gardasil  Benadryl      Hepatitis A         Hepatitis B Nasal Spray:       Pneumovax  Saline Nasal Spray     Polio Booster         Tetanus Nausea:       Tuberculosis test or PPD  Vitamin B6 25 mg TID   AVOID:    Dramamine      *Gardasil  Emetrol       Live Poliovirus  Ginger Root 250 mg QID    MMR (measles, mumps &  High Complex Carbs @ Bedtime    rebella)  Sea Bands-Accupressure    Varicella (Chickenpox)  Unisom 1/2 tab TID     *No known complications           If received before Pain:         Known pregnancy;   Darvocet       Resume series after  Lortab        Delivery  Percocet    Yeast:   Tramadol      Femstat  Tylenol 3      Gyne-lotrimin  Ultram       Monistat  Vicodin           MISC:         All Sunscreens           Hair Coloring/highlights          Insect Repellant's          (Including DEET)         Mystic Tans   Third Trimester of Pregnancy The third trimester of pregnancy is from week 28 through week 40. This is also called months 7 through 9. This trimester is when your unborn baby (fetus) is growing very fast. At the end of the ninth month, the unborn baby is about 20 inches long. It weighs about 6-10 pounds. Body changes during your third trimester Your body continues to go through many changes during this time. The changes vary and generally return to normal after the baby is born. Physical changes Your weight will continue to increase. You  may gain 25-35 pounds (11-16 kg) by the end of the pregnancy. If you are underweight, you may gain 28-40 lb (about 13-18 kg). If you are overweight, you may gain 15-25 lb (about 7-11 kg). You may start to get stretch marks on your hips, belly (abdomen), and breasts. Your breasts will continue to grow and may hurt. A yellow fluid (colostrum) may leak from your breasts. This is the first milk you are making for your baby. You may have changes in your hair. Your belly button may stick out. You may have more swelling in your hands, face, or ankles. Health changes You may have heartburn. You may have trouble pooping (constipation). You may get hemorrhoids. These are swollen veins in the butt that can itch or get painful. You may have swollen veins (varicose veins) in your legs. You may have more body aches in the pelvis, back, or thighs. You may have more tingling or numbness in your hands, arms, and legs. The skin on your belly may also feel numb. You may  feel short of breath as your womb (uterus) gets bigger. Other changes You may pee (urinate) more often. You may have more problems sleeping. You may notice the unborn baby "dropping," or moving lower in your belly. You may have more discharge coming from your vagina. Your joints may feel loose, and you may have pain around your pelvic bone. Follow these instructions at home: Medicines Take over-the-counter and prescription medicines only as told by your doctor. Some medicines are not safe during pregnancy. Take a prenatal vitamin that contains at least 600 micrograms (mcg) of folic acid. Eating and drinking Eat healthy meals that include: Fresh fruits and vegetables. Whole grains. Good sources of protein, such as meat, eggs, or tofu. Low-fat dairy products. Avoid raw meat and unpasteurized juice, milk, and cheese. These carry germs that can harm you and your baby. Eat 4 or 5 small meals rather than 3 large meals a day. You may need to  take these actions to prevent or treat trouble pooping: Drink enough fluids to keep your pee (urine) pale yellow. Eat foods that are high in fiber. These include beans, whole grains, and fresh fruits and vegetables. Limit foods that are high in fat and sugar. These include fried or sweet foods. Activity Exercise only as told by your doctor. Stop exercising if you start to have cramps in your womb. Avoid heavy lifting. Do not exercise if it is too hot or too humid, or if you are in a place of great height (high altitude). If you choose to, you may have sex unless your doctor tells you not to. Relieving pain and discomfort Take breaks often, and rest with your legs raised (elevated) if you have leg cramps or low back pain. Take warm water baths (sitz baths) to soothe pain or discomfort caused by hemorrhoids. Use hemorrhoid cream if your doctor approves. Wear a good support bra if your breasts are tender. If you develop bulging, swollen veins in your legs: Wear support hose as told by your doctor. Raise your feet for 15 minutes, 3-4 times a day. Limit salt in your food. Safety Talk to your doctor before traveling far distances. Do not use hot tubs, steam rooms, or saunas. Wear your seat belt at all times when you are in a car. Talk with your doctor if someone is hurting you or yelling at you a lot. Preparing for your baby's arrival To prepare for the arrival of your baby: Take prenatal classes. Visit the hospital and tour the maternity area. Buy a rear-facing car seat. Learn how to install it in your car. Prepare the baby's room. Take out all pillows and stuffed animals from the baby's crib. General instructions Avoid cat litter boxes and soil used by cats. These carry germs that can cause harm to the baby and can cause a loss of your baby by miscarriage or stillbirth. Do not douche or use tampons. Do not use scented sanitary pads. Do not smoke or use any products that contain nicotine or  tobacco. If you need help quitting, ask your doctor. Do not drink alcohol. Do not use herbal medicines, illegal drugs, or medicines that were not approved by your doctor. Chemicals in these products can affect your baby. Keep all follow-up visits. This is important. Where to find more information American Pregnancy Association: americanpregnancy.org [employer] of [employer]stetricians and Gynecologists: www.acog.org Office on Women's Health: KeywordPortfolios.com.br Contact a doctor if: You have a fever. You have mild cramps or pressure in your lower belly. You have a  nagging pain in your belly area. You vomit, or you have watery poop (diarrhea). You have bad-smelling fluid coming from your vagina. You have pain when you pee, or your pee smells bad. You have a headache that does not go away when you take medicine. You have changes in how you see, or you see spots in front of your eyes. Get help right away if: Your water breaks. You have regular contractions that are less than 5 minutes apart. You are spotting or bleeding from your vagina. You have very bad belly cramps or pain. You have trouble breathing. You have chest pain. You faint. You have not felt the baby move for the amount of time told by your doctor. You have new or increased pain, swelling, or redness in an arm or leg. Summary The third trimester is from week 28 through week 40 (months 7 through 9). This is the time when your unborn baby is growing very fast. During this time, your discomfort may increase as you gain weight and as your baby grows. Get ready for your baby to arrive by taking prenatal classes, buying a rear-facing car seat, and preparing the baby's room. Get help right away if you are bleeding from your vagina, you have chest pain and trouble breathing, or you have not felt the baby move for the amount of time told by your doctor. This information is not intended to replace advice given to you by your health  care provider. Make sure you discuss any questions you have with your health care provider. Document Revised: 07/03/2019 Document Reviewed: 05/09/2019 Elsevier Patient Education  [place].   Influenza (Flu) Vaccine (Inactivated or Recombinant): What You Need to Know 1. Why get vaccinated? Influenza vaccine can prevent influenza (flu). Flu is a contagious disease that spreads around the [place] every year, usually between October and May. Anyone can get the flu, but it is more dangerous for some people. Infants and young children, people 50 years and older, pregnant people, and people with certain health conditions or a weakened immune system are at greatest risk of flu complications. Pneumonia, bronchitis, sinus infections, and ear infections are examples of flu-related complications. If you have a medical condition, such as heart disease, cancer, or diabetes, flu can make it worse. Flu can cause fever and chills, sore throat, muscle aches, fatigue, cough, headache, and runny or stuffy nose. Some people may have vomiting and diarrhea, though this is more common in children than adults. In an average year, thousands of people in the [place] die from flu, and many more are hospitalized. Flu vaccine prevents millions of illnesses and flu-related visits to the doctor each year. 2. Influenza vaccines CDC recommends everyone 6 months and older get vaccinated every flu season. Children 6 months through 63 years of age may need 2 doses during a single flu season. Everyone else needs only 1 dose each flu season. It takes about 2 weeks for protection to develop after vaccination. There are many flu viruses, and they are always changing. Each year a new flu vaccine is made to protect against the influenza viruses believed to be likely to cause disease in the upcoming flu season. Even when the vaccine doesn't exactly match these viruses, it may still provide some protection. Influenza  vaccine does not cause flu. Influenza vaccine may be given at the same time as other vaccines. 3. Talk with your health care provider Tell your vaccination provider if the person getting the vaccine: Has had  an allergic reaction after a previous dose of influenza vaccine, or has any severe, life-threatening allergies Has ever had Guillain-Barr Syndrome (also called "GBS") In some cases, your health care provider may decide to postpone influenza vaccination until a future visit. Influenza vaccine can be administered at any time during pregnancy. People who are or will be pregnant during influenza season should receive inactivated influenza vaccine. People with minor illnesses, such as a cold, may be vaccinated. People who are moderately or severely ill should usually wait until they recover before getting influenza vaccine. Your health care provider can give you more information. 4. Risks of a vaccine reaction Soreness, redness, and swelling where the shot is given, fever, muscle aches, and headache can happen after influenza vaccination. There may be a very small increased risk of Guillain-Barr Syndrome (GBS) after inactivated influenza vaccine (the flu shot). Young children who get the flu shot along with pneumococcal vaccine (PCV13) and/or DTaP vaccine at the same time might be slightly more likely to have a seizure caused by fever. Tell your health care provider if a child who is getting flu vaccine has ever had a seizure. People sometimes faint after medical procedures, including vaccination. Tell your provider if you feel dizzy or have vision changes or ringing in the ears. As with any medicine, there is a very remote chance of a vaccine causing a severe allergic reaction, other serious injury, or death. 5. What if there is a serious problem? An allergic reaction could occur after the vaccinated person leaves the clinic. If you see signs of a severe allergic reaction (hives, swelling of the  face and throat, difficulty breathing, a fast heartbeat, dizziness, or weakness), call 9-1-1 and get the person to the nearest hospital. For other signs that concern you, call your health care provider. Adverse reactions should be reported to the Vaccine Adverse Event Reporting System (VAERS). Your health care provider will usually file this report, or you can do it yourself. Visit the VAERS website at www.vaers.SamedayNews.es or call (628)055-2881. VAERS is only for reporting reactions, and VAERS staff members do not give medical advice. 6. The National Vaccine Injury Compensation Program The [employer] Vaccine Injury Compensation Program (VICP) is a federal program that was created to compensate people who may have been injured by certain vaccines. Claims regarding alleged injury or death due to vaccination have a time limit for filing, which may be as short as two years. Visit the VICP website at GoldCloset.com.ee or call (843)629-4859 to learn about the program and about filing a claim. 7. How can I learn more? Ask your health care provider. Call your local or state health department. Visit the website of the Food and Drug Administration (FDA) for vaccine package inserts and additional information at TraderRating.uy. Contact the Centers for Disease Control and Prevention (CDC): Call (580)620-8083 (1-800-CDC-INFO) or Visit CDC's website at https://gibson.com/. Vaccine Information Statement Inactivated Influenza Vaccine (09/13/2019) This information is not intended to replace advice given to you by your health care provider. Make sure you discuss any questions you have with your health care provider. Document Revised: 10/31/2019 Document Reviewed: 10/31/2019 Elsevier Patient Education  2022 [employer].

## 2020-11-06 ENCOUNTER — Encounter: Payer: Self-pay | Admitting: Obstetrics and Gynecology

## 2020-11-06 ENCOUNTER — Other Ambulatory Visit: Payer: Self-pay

## 2020-11-06 ENCOUNTER — Ambulatory Visit (INDEPENDENT_AMBULATORY_CARE_PROVIDER_SITE_OTHER): Payer: BC Managed Care – PPO | Admitting: Obstetrics and Gynecology

## 2020-11-06 VITALS — BP 108/71 | HR 102 | Wt 224.8 lb

## 2020-11-06 DIAGNOSIS — Z3483 Encounter for supervision of other normal pregnancy, third trimester: Secondary | ICD-10-CM

## 2020-11-06 DIAGNOSIS — Z3A31 31 weeks gestation of pregnancy: Secondary | ICD-10-CM

## 2020-11-06 LAB — POCT URINALYSIS DIPSTICK OB
Bilirubin, UA: NEGATIVE
Blood, UA: NEGATIVE
Glucose, UA: NEGATIVE
Ketones, UA: NEGATIVE
Nitrite, UA: NEGATIVE
Spec Grav, UA: 1.01 (ref 1.010–1.025)
Urobilinogen, UA: 0.2 E.U./dL
pH, UA: 7.5 (ref 5.0–8.0)

## 2020-11-06 NOTE — Progress Notes (Signed)
   OB-Pt present for routine prenatal care. Pt stated fetal movement present; no contractions present; no vaginal bleeding and no changes in vaginal discharge.     Pt stated that she concerned that she does not look [redacted] weeks pregnant. Pt is unsure about flu vaccine at this time will get at a later date.

## 2020-11-06 NOTE — Progress Notes (Signed)
ROB: Discussed concerns about size of abdomen, does not feel like she looks [redacted] weeks pregnant.  Given reassurance.  Plans to breastfeed. Reviewed breastfeeding topics (see checklist). Declines flu vaccine today. Discussed pain management in labor, plans for epidural. Plans for May consider later in the pregnancy. Unsure on contraceptive method, given handout.  RTC in 2 weeks.

## 2020-11-18 ENCOUNTER — Ambulatory Visit (INDEPENDENT_AMBULATORY_CARE_PROVIDER_SITE_OTHER): Payer: BC Managed Care – PPO | Admitting: Obstetrics and Gynecology

## 2020-11-18 ENCOUNTER — Other Ambulatory Visit: Payer: Self-pay

## 2020-11-18 ENCOUNTER — Encounter: Payer: Self-pay | Admitting: Obstetrics and Gynecology

## 2020-11-18 VITALS — BP 112/75 | HR 98 | Wt 228.3 lb

## 2020-11-18 DIAGNOSIS — Z3483 Encounter for supervision of other normal pregnancy, third trimester: Secondary | ICD-10-CM

## 2020-11-18 LAB — POCT URINALYSIS DIPSTICK OB
Bilirubin, UA: NEGATIVE
Blood, UA: NEGATIVE
Glucose, UA: NEGATIVE
Ketones, UA: NEGATIVE
Leukocytes, UA: NEGATIVE
Nitrite, UA: NEGATIVE
Odor: NEGATIVE
POC,PROTEIN,UA: NEGATIVE
Spec Grav, UA: <=1.005 — AB (ref 1.010–1.025)
Urobilinogen, UA: 0.2 E.U./dL
pH, UA: 8.5 — AB (ref 5.0–8.0)

## 2020-11-18 NOTE — Progress Notes (Signed)
ROB- no complaints. Declines flu vaccine.

## 2020-11-18 NOTE — Progress Notes (Signed)
ROB: Active baby.  Taking vitamins as directed.  Patient's last day at work today!!  She is excited about taking time to organize prior to birth.

## 2020-11-26 ENCOUNTER — Telehealth: Payer: Self-pay | Admitting: Obstetrics and Gynecology

## 2020-11-26 NOTE — Telephone Encounter (Signed)
Patient called and stated that she has not gotten her FMLA paperwork back..please advise patient on when paperwork will be ready

## 2020-11-26 NOTE — Telephone Encounter (Signed)
Pt is aware that her FMLA forms has been completed and faxed in. Pt stated that she would come by the office tomorrow and pick up copies of the forms.

## 2020-12-04 ENCOUNTER — Ambulatory Visit (INDEPENDENT_AMBULATORY_CARE_PROVIDER_SITE_OTHER): Payer: BC Managed Care – PPO | Admitting: Obstetrics and Gynecology

## 2020-12-04 ENCOUNTER — Encounter: Payer: Self-pay | Admitting: Obstetrics and Gynecology

## 2020-12-04 ENCOUNTER — Other Ambulatory Visit: Payer: Self-pay

## 2020-12-04 VITALS — BP 131/86 | HR 105 | Wt 229.3 lb

## 2020-12-04 DIAGNOSIS — R030 Elevated blood-pressure reading, without diagnosis of hypertension: Secondary | ICD-10-CM

## 2020-12-04 DIAGNOSIS — Z3A35 35 weeks gestation of pregnancy: Secondary | ICD-10-CM

## 2020-12-04 DIAGNOSIS — Z3483 Encounter for supervision of other normal pregnancy, third trimester: Secondary | ICD-10-CM

## 2020-12-04 LAB — POCT URINALYSIS DIPSTICK OB
Bilirubin, UA: NEGATIVE
Blood, UA: NEGATIVE
Glucose, UA: NEGATIVE
Ketones, UA: NEGATIVE
Leukocytes, UA: NEGATIVE
Nitrite, UA: NEGATIVE
Spec Grav, UA: 1.015 (ref 1.010–1.025)
Urobilinogen, UA: 0.2 E.U./dL
pH, UA: 6.5 (ref 5.0–8.0)

## 2020-12-04 NOTE — Progress Notes (Signed)
ROB: Doing well, no complaints.  Declines flu vaccine. Initial BP elevated but repeat normal. Continue to monitor.  Partner is considering vasectomy for contraception (he has 4 other children). RTC in 2 weeks. For third trimester cultures at that time.

## 2020-12-04 NOTE — Progress Notes (Signed)
ROB: She feels pretty good, no new concerns. Unable to leave urine at intake. Declined flu.

## 2020-12-11 ENCOUNTER — Encounter: Payer: BC Managed Care – PPO | Admitting: Obstetrics and Gynecology

## 2020-12-16 ENCOUNTER — Other Ambulatory Visit: Payer: Self-pay

## 2020-12-16 ENCOUNTER — Ambulatory Visit (INDEPENDENT_AMBULATORY_CARE_PROVIDER_SITE_OTHER): Payer: BC Managed Care – PPO | Admitting: Obstetrics and Gynecology

## 2020-12-16 ENCOUNTER — Encounter: Payer: Self-pay | Admitting: Obstetrics and Gynecology

## 2020-12-16 VITALS — BP 116/82 | HR 103 | Wt 230.0 lb

## 2020-12-16 DIAGNOSIS — Z3403 Encounter for supervision of normal first pregnancy, third trimester: Secondary | ICD-10-CM

## 2020-12-16 DIAGNOSIS — Z3A36 36 weeks gestation of pregnancy: Secondary | ICD-10-CM

## 2020-12-16 LAB — POCT URINALYSIS DIPSTICK OB
Bilirubin, UA: NEGATIVE
Blood, UA: NEGATIVE
Glucose, UA: NEGATIVE
Ketones, UA: NEGATIVE
Leukocytes, UA: NEGATIVE
Nitrite, UA: NEGATIVE
POC,PROTEIN,UA: NEGATIVE
Spec Grav, UA: 1.01 (ref 1.010–1.025)
Urobilinogen, UA: 0.2 E.U./dL
pH, UA: 8 (ref 5.0–8.0)

## 2020-12-16 LAB — OB RESULTS CONSOLE GC/CHLAMYDIA: Gonorrhea: NEGATIVE

## 2020-12-16 NOTE — Progress Notes (Signed)
ROB: No complaints.  Signs and symptoms of labor discussed.  Cultures performed.

## 2020-12-16 NOTE — Progress Notes (Signed)
ROB: She feels tired, but has no other concerns.

## 2020-12-18 LAB — STREP GP B NAA: Strep Gp B NAA: NEGATIVE

## 2020-12-19 LAB — GC/CHLAMYDIA PROBE AMP
Chlamydia trachomatis, NAA: NEGATIVE
Neisseria Gonorrhoeae by PCR: NEGATIVE

## 2020-12-23 ENCOUNTER — Other Ambulatory Visit: Payer: Self-pay

## 2020-12-23 ENCOUNTER — Ambulatory Visit (INDEPENDENT_AMBULATORY_CARE_PROVIDER_SITE_OTHER): Payer: BC Managed Care – PPO | Admitting: Obstetrics and Gynecology

## 2020-12-23 ENCOUNTER — Encounter: Payer: Self-pay | Admitting: Obstetrics and Gynecology

## 2020-12-23 VITALS — BP 92/52 | HR 66 | Wt 230.3 lb

## 2020-12-23 DIAGNOSIS — Z3403 Encounter for supervision of normal first pregnancy, third trimester: Secondary | ICD-10-CM

## 2020-12-23 DIAGNOSIS — Z3A37 37 weeks gestation of pregnancy: Secondary | ICD-10-CM

## 2020-12-23 LAB — POCT URINALYSIS DIPSTICK OB
Bilirubin, UA: NEGATIVE
Glucose, UA: NEGATIVE
Ketones, UA: NEGATIVE
Leukocytes, UA: NEGATIVE
Nitrite, UA: NEGATIVE
Spec Grav, UA: 1.025 (ref 1.010–1.025)
Urobilinogen, UA: 0.2 E.U./dL
pH, UA: 6 (ref 5.0–8.0)

## 2020-12-23 NOTE — Progress Notes (Signed)
ROB: Patient overall doing well, feels some fatigue. Discussed circumcision for female infant. Reiterated labor precautions. Discussed travel precautions as patient will be going out of town for Thanksgiving next week. Advised on possibility of IOL by her due date if BMI >40 (currently 39.5).  36 week cultures negative. RTC in 1 week.

## 2020-12-23 NOTE — Progress Notes (Signed)
ROB: Feels a little tired today, but no concerns.

## 2020-12-23 NOTE — Patient Instructions (Signed)
Signs and Symptoms of Labor ?Labor is the body's natural process of moving the baby and the placenta out of the uterus. The process of labor usually starts when the baby is full-term, between 39 and 41 weeks of pregnancy. ?Signs and symptoms that you are close to going into labor ?As your body prepares for labor and the birth of your baby, you may notice the following symptoms in the weeks and days before true labor starts: ?Passing a small amount of thick, bloody mucus from your vagina. This is called normal bloody show or losing your mucus plug. This may happen more than a week before labor begins, or right before labor begins, as the opening of the cervix starts to widen (dilate). For some women, the entire mucus plug passes at once. For others, pieces of the mucus plug may gradually pass over several days. ?Your baby moving (dropping) lower in your pelvis to get into position for birth (lightening). When this happens, you may feel more pressure on your bladder and pelvic bone and less pressure on your ribs. This may make it easier to breathe. It may also cause you to need to urinate more often and have problems with bowel movements. ?Having "practice contractions," also called Braxton Hicks contractions or false labor. These occur at irregular (unevenly spaced) intervals that are more than 10 minutes apart. False labor contractions are common after exercise or sexual activity. They will stop if you change position, rest, or drink fluids. These contractions are usually mild and do not get stronger over time. They may feel like: ?A backache or back pain. ?Mild cramps, similar to menstrual cramps. ?Tightening or pressure in your abdomen. ?Other early symptoms include: ?Nausea or loss of appetite. ?Diarrhea. ?Having a sudden burst of energy, or feeling very tired. ?Mood changes. ?Having trouble sleeping. ?Signs and symptoms that labor has begun ?Signs that you are in labor may include: ?Having contractions that come  at regular (evenly spaced) intervals and increase in intensity. This may feel like more intense tightening or pressure in your abdomen that moves to your back. ?Contractions may also feel like rhythmic pain in your upper thighs or back that comes and goes at regular intervals. ?If you are delivering for the first time, this change in intensity of contractions often occurs at a more gradual pace. ?If you have given birth before, you may notice a more rapid progression of contraction changes. ?Feeling pressure in the vaginal area. ?Your water breaking (rupture of membranes). This is when the sac of fluid that surrounds your baby breaks. Fluid leaking from your vagina may be clear or blood-tinged. Labor usually starts within 24 hours of your water breaking, but it may take longer to begin. ?Some people may feel a sudden gush of fluid; others may notice repeatedly damp underwear. ?Follow these instructions at home: ? ?When labor starts, or if your water breaks, call your health care provider or nurse care line. Based on your situation, they will determine when you should go in for an exam. ?During early labor, you may be able to rest and manage symptoms at home. Some strategies to try at home include: ?Breathing and relaxation techniques. ?Taking a warm bath or shower. ?Listening to music. ?Using a heating pad on the lower back for pain. If directed, apply heat to the area as often as told by your health care provider. Use the heat source that your health care provider recommends, such as a moist heat pack or a heating pad. ?Place a   towel between your skin and the heat source. ?Leave the heat on for 20-30 minutes. ?Remove the heat if your skin turns bright red. This is especially important if you are unable to feel pain, heat, or cold. You have a greater risk of getting burned. ?Contact a health care provider if: ?Your labor has started. ?Your water breaks. ?You have nausea, vomiting, or diarrhea. ?Get help right away  if: ?You have painful, regular contractions that are 5 minutes apart or less. ?Labor starts before you are [redacted] weeks along in your pregnancy. ?You have a fever. ?You have bright red blood coming from your vagina. ?You do not feel your baby moving. ?You have a severe headache with or without vision problems. ?You have chest pain or shortness of breath. ?These symptoms may represent a serious problem that is an emergency. Do not wait to see if the symptoms will go away. Get medical help right away. Call your local emergency services (911 in the [place] Do not drive yourself to the hospital. ?Summary ?Labor is your body's natural process of moving your baby and the placenta out of your uterus. ?The process of labor usually starts when your baby is full-term, between 39 and 40 weeks of pregnancy. ?When labor starts, or if your water breaks, call your health care provider or nurse care line. Based on your situation, they will determine when you should go in for an exam. ?This information is not intended to replace advice given to you by your health care provider. Make sure you discuss any questions you have with your health care provider. ?Document Revised: 06/09/2020 Document Reviewed: 06/09/2020 ?Elsevier Patient Education ? 2022 [employer] ? ?

## 2020-12-29 ENCOUNTER — Ambulatory Visit (INDEPENDENT_AMBULATORY_CARE_PROVIDER_SITE_OTHER): Payer: BC Managed Care – PPO | Admitting: Obstetrics and Gynecology

## 2020-12-29 ENCOUNTER — Other Ambulatory Visit: Payer: Self-pay

## 2020-12-29 VITALS — BP 117/77 | HR 101 | Wt 229.0 lb

## 2020-12-29 DIAGNOSIS — Z3403 Encounter for supervision of normal first pregnancy, third trimester: Secondary | ICD-10-CM

## 2020-12-29 DIAGNOSIS — Z3A38 38 weeks gestation of pregnancy: Secondary | ICD-10-CM

## 2020-12-29 LAB — POCT URINALYSIS DIPSTICK OB
Blood, UA: NEGATIVE
Glucose, UA: NEGATIVE
Ketones, UA: NEGATIVE
Leukocytes, UA: NEGATIVE
Nitrite, UA: NEGATIVE
POC,PROTEIN,UA: NEGATIVE
Spec Grav, UA: 1.02 (ref 1.010–1.025)
pH, UA: 6.5 (ref 5.0–8.0)

## 2020-12-29 NOTE — Progress Notes (Signed)
ROB:  Occ heartburn at night.  Denies contractions.  Daily fetal movement.

## 2021-01-05 ENCOUNTER — Ambulatory Visit (INDEPENDENT_AMBULATORY_CARE_PROVIDER_SITE_OTHER): Payer: BC Managed Care – PPO | Admitting: Obstetrics and Gynecology

## 2021-01-05 ENCOUNTER — Encounter: Payer: Self-pay | Admitting: Obstetrics and Gynecology

## 2021-01-05 ENCOUNTER — Other Ambulatory Visit: Payer: Self-pay

## 2021-01-05 VITALS — BP 115/78 | HR 112 | Wt 233.1 lb

## 2021-01-05 DIAGNOSIS — Z3403 Encounter for supervision of normal first pregnancy, third trimester: Secondary | ICD-10-CM

## 2021-01-05 DIAGNOSIS — Z3A39 39 weeks gestation of pregnancy: Secondary | ICD-10-CM

## 2021-01-05 LAB — POCT URINALYSIS DIPSTICK OB
Bilirubin, UA: NEGATIVE
Glucose, UA: NEGATIVE
Ketones, UA: NEGATIVE
Leukocytes, UA: NEGATIVE
Nitrite, UA: NEGATIVE
Spec Grav, UA: 1.02 (ref 1.010–1.025)
Urobilinogen, UA: 0.2 E.U./dL
pH, UA: 6.5 (ref 5.0–8.0)

## 2021-01-05 NOTE — Progress Notes (Signed)
Patient doing well, no concerns.

## 2021-01-05 NOTE — Progress Notes (Signed)
ROB: She is doing well, she has no concerns. Discussed IOL, as BMI now at 40. Will plan for 01/10/2021. Reviewed labor precautions. Discussed need for COVID testing. Will plan for outpatient foley bulb on Friday.

## 2021-01-05 NOTE — Patient Instructions (Addendum)
COVID 19 Instructions for Scheduled Procedure (Inductions/C-sections and GYN surgeries)   Thank you for choosing [facility] for your services.  You have been scheduled for a procedure called _______Induction of Labor__________.    Your procedure is scheduled on _________Sunday, January 10, 2021 at 8:00 AM_____________.  You are required to have COVID-19 testing performed 2 days prior to your scheduled procedure date.  Testing is performed between 8 AM and 12 PM Monday through Friday.  Please present for testing on __Friday, December 2, 2022_________ during this hour. Testing is performed in front of the [facility] in the Pre-Admission Testing area on the second floor. Please call [phone] to schedule your COVID testing.     Upon your scheduled procedure date, you will need to arrive at the [facility] entrance. (There is a statue at the front of this entrance.)   Please arrive on time if you are scheduled for an induction of labor.   If you are scheduled for a Cesarean delivery or for Gyn Surgery, arrive 2 hours prior to your procedure time.  If you are an Obstetric patient and your arrival time falls between 11 PM and 6 AM call L&D [phone]) when you arrive.  A staff member will meet you at the [facility] entrance.  At this time, patients are allowed 2 support persons to accompany them. Face masks are required for you and your support person(s). Your support person(s) are now allowed to be there with you during the entire time of your admission.   Please contact the office if you have any questions regarding this information.  The [facility] office number is [phone].     Thank you,    Your [facility] Providers     Labor Induction Labor induction is when steps are taken to cause a pregnant woman to begin the labor process. Most women go into labor on their own between 37 weeks and 42 weeks of pregnancy. When this does not happen, or when there is a  medical need for labor to begin, steps may be taken to induce, or bring on, labor. Labor induction causes a pregnant woman's uterus to contract. It also causes the cervix to soften (ripen), open (dilate), and thin out. Usually, labor is not induced before 39 weeks of pregnancy unless there is a medical reason to do so. When is labor induction considered? Labor induction may be right for you if: Your pregnancy lasts longer than 41 to 42 weeks. Your placenta is separating from your uterus (placental abruption). You have a rupture of membranes and your labor does not begin. You have health problems, like diabetes or high blood pressure (preeclampsia) during your pregnancy. Your baby has stopped growing or does not have enough amniotic fluid. Before labor induction begins, your health care provider will consider the following factors: Your medical condition and the baby's condition. How many weeks you have been pregnant. How mature the baby's lungs are. The condition of your cervix. The position of the baby. The size of your birth canal. Tell a health care provider about: Any allergies you have. All medicines you are taking, including vitamins, herbs, eye drops, creams, and over-the-counter medicines. Any problems you or your family members have had with anesthetic medicines. Any surgeries you have had. Any blood disorders you have. Any medical conditions you have. What are the risks? Generally, this is a safe procedure. However, problems may occur, including: Failed induction. Changes in fetal heart rate, such as being  too high, too low, or irregular (erratic). Infection in the mother or the baby. Increased risk of having a cesarean delivery. Breaking off (abruption) of the placenta from the uterus. This is rare. Rupture of the uterus. This is very rare. Your baby could fail to get enough blood flow or oxygen. This can be life-threatening. When induction is needed for medical reasons,  the benefits generally outweigh the risks. What happens during the procedure? During the procedure, your health care provider will use one of these methods to induce labor: Stripping the membranes. In this method, the amniotic sac tissue is gently separated from the cervix. This causes the following to happen: Your cervix stretches, which in turn causes the release of prostaglandins. Prostaglandins induce labor and cause the uterus to contract. This procedure is often done in an office visit. You will be sent home to wait for contractions to begin. Prostaglandin medicine. This medicine starts contractions and causes the cervix to dilate and ripen. This can be taken by mouth (orally) or by being inserted into the vagina (suppository). Inserting a small, thin tube (catheter) with a balloon into the vagina and then expanding the balloon with water to dilate the cervix. Breaking the water. In this method, a small instrument is used to make a small hole in the amniotic sac. This eventually causes the amniotic sac to break. Contractions should begin within a few hours. Medicine to trigger or strengthen contractions. This medicine is given through an IV that is inserted into a vein in your arm. This procedure may vary among health care providers and hospitals. Where to find more information [employer]: www.marchofdimes.org [employer]: www.acog.org Summary Labor induction causes a pregnant woman's uterus to contract. It also causes the cervix to soften (ripen), open (dilate), and thin out. Labor is usually not induced before 39 weeks of pregnancy unless there is a medical reason to do so. When induction is needed for medical reasons, the benefits generally outweigh the risks. Talk with your health care provider about which methods of labor induction are right for you. This information is not intended to replace advice given to you by your health care  provider. Make sure you discuss any questions you have with your health care provider. Document Revised: 11/07/2019 Document Reviewed: 11/07/2019 Elsevier Patient Education  2022 [employer].

## 2021-01-08 ENCOUNTER — Other Ambulatory Visit: Payer: Self-pay

## 2021-01-08 ENCOUNTER — Other Ambulatory Visit: Payer: BC Managed Care – PPO

## 2021-01-08 ENCOUNTER — Ambulatory Visit (INDEPENDENT_AMBULATORY_CARE_PROVIDER_SITE_OTHER): Payer: BC Managed Care – PPO | Admitting: Obstetrics and Gynecology

## 2021-01-08 ENCOUNTER — Encounter: Payer: Self-pay | Admitting: Obstetrics and Gynecology

## 2021-01-08 ENCOUNTER — Other Ambulatory Visit
Admission: RE | Admit: 2021-01-08 | Discharge: 2021-01-08 | Disposition: A | Discharge status: Home / Self Care | Payer: BC Managed Care – PPO | Source: Ambulatory Visit | Attending: Obstetrics and Gynecology | Admitting: Obstetrics and Gynecology

## 2021-01-08 ENCOUNTER — Other Ambulatory Visit: Payer: Self-pay | Admitting: Obstetrics and Gynecology

## 2021-01-08 VITALS — BP 126/81 | HR 85 | Ht 64.0 in | Wt 233.6 lb

## 2021-01-08 DIAGNOSIS — Z20822 Contact with and (suspected) exposure to covid-19: Secondary | ICD-10-CM | POA: Insufficient documentation

## 2021-01-08 DIAGNOSIS — O480 Post-term pregnancy: Secondary | ICD-10-CM | POA: Diagnosis not present

## 2021-01-08 DIAGNOSIS — Z3403 Encounter for supervision of normal first pregnancy, third trimester: Secondary | ICD-10-CM

## 2021-01-08 DIAGNOSIS — Z3A40 40 weeks gestation of pregnancy: Secondary | ICD-10-CM

## 2021-01-08 DIAGNOSIS — Z01812 Encounter for preprocedural laboratory examination: Secondary | ICD-10-CM | POA: Insufficient documentation

## 2021-01-08 LAB — POCT URINALYSIS DIPSTICK OB
Bilirubin, UA: NEGATIVE
Blood, UA: NEGATIVE
Glucose, UA: NEGATIVE
Ketones, UA: NEGATIVE
Leukocytes, UA: NEGATIVE
Nitrite, UA: NEGATIVE
POC,PROTEIN,UA: NEGATIVE
Spec Grav, UA: 1.02 (ref 1.010–1.025)
Urobilinogen, UA: 0.2 E.U./dL
pH, UA: 6.5 (ref 5.0–8.0)

## 2021-01-08 LAB — SARS CORONAVIRUS 2 (TAT 6-24 HRS): SARS Coronavirus 2: NEGATIVE

## 2021-01-08 NOTE — Progress Notes (Signed)
Patient reports contractions yesterday x 40 minutes, and loss of mucus plug.   FOLEY BULB PROCEDURE NOTE:    I have verified that the patient is an appropriate candidate for outpatient foley bulb placement.  She has consented to foley bulb placement today. She has no concerns at this time. A pre-procedure NST performed today was reviewed and was found to be reactive.     The patient was placed in the dorsal lithotomy position.  A vaginal examination was performed and a finger was placed at the base of the cervical os.  The cervix was noted to be 1.5 cm dilated  A foley catheter was advanced into the cervix slightly pass the internal cervical os.  The foley balloon was filled with 40 cc of normal saline.  Gentle traction was placed on the foley bulb, and the catheter was taped to the medial portion of the thigh.  The patient tolerated the procedure well.  A post-procedure NST was performed. Sterile technique was observed throughout.      NONSTRESS TEST INTERPRETATION  INDICATIONS: Foley bulb induction placement  FHR baseline: 145 bpm (pre-procedure) and 130  bpm (post procedure) RESULTS:Reactive x 2.  COMMENTS: Uterine irritability   PLAN: 1. To arrive at Labor and Delivery for scheduled induction of labor on 01/11/2021 at 0800.  She was given post-procedure instructions.

## 2021-01-08 NOTE — Patient Instructions (Signed)

## 2021-01-09 ENCOUNTER — Inpatient Hospital Stay: Payer: BC Managed Care – PPO | Admitting: Anesthesiology

## 2021-01-09 ENCOUNTER — Encounter: Payer: Self-pay | Admitting: Obstetrics and Gynecology

## 2021-01-09 ENCOUNTER — Other Ambulatory Visit: Payer: Self-pay

## 2021-01-09 ENCOUNTER — Inpatient Hospital Stay
Admission: EM | Admit: 2021-01-09 | Discharge: 2021-01-10 | DRG: 807 | Disposition: A | Discharge status: Home / Self Care | Payer: BC Managed Care – PPO | Attending: Obstetrics and Gynecology | Admitting: Obstetrics and Gynecology

## 2021-01-09 DIAGNOSIS — O99214 Obesity complicating childbirth: Secondary | ICD-10-CM

## 2021-01-09 DIAGNOSIS — O9902 Anemia complicating childbirth: Secondary | ICD-10-CM

## 2021-01-09 DIAGNOSIS — O326XX0 Maternal care for compound presentation, not applicable or unspecified: Secondary | ICD-10-CM | POA: Diagnosis present

## 2021-01-09 DIAGNOSIS — Z20822 Contact with and (suspected) exposure to covid-19: Secondary | ICD-10-CM | POA: Diagnosis present

## 2021-01-09 DIAGNOSIS — O99210 Obesity complicating pregnancy, unspecified trimester: Secondary | ICD-10-CM | POA: Diagnosis present

## 2021-01-09 DIAGNOSIS — D509 Iron deficiency anemia, unspecified: Secondary | ICD-10-CM

## 2021-01-09 DIAGNOSIS — E669 Obesity, unspecified: Secondary | ICD-10-CM | POA: Diagnosis not present

## 2021-01-09 DIAGNOSIS — O26893 Other specified pregnancy related conditions, third trimester: Secondary | ICD-10-CM | POA: Diagnosis present

## 2021-01-09 DIAGNOSIS — Z3A40 40 weeks gestation of pregnancy: Secondary | ICD-10-CM | POA: Diagnosis not present

## 2021-01-09 DIAGNOSIS — O480 Post-term pregnancy: Secondary | ICD-10-CM | POA: Diagnosis present

## 2021-01-09 HISTORY — DX: Other specified health status: Z78.9

## 2021-01-09 LAB — TYPE AND SCREEN
ABO/RH(D): AB POS
Antibody Screen: NEGATIVE

## 2021-01-09 LAB — CBC
HCT: 35 % — ABNORMAL LOW (ref 36.0–46.0)
Hemoglobin: 11.3 g/dL — ABNORMAL LOW (ref 12.0–15.0)
MCH: 27 pg (ref 26.0–34.0)
MCHC: 32.3 g/dL (ref 30.0–36.0)
MCV: 83.7 fL (ref 80.0–100.0)
Platelets: 228 10*3/uL (ref 150–400)
RBC: 4.18 MIL/uL (ref 3.87–5.11)
RDW: 13.7 % (ref 11.5–15.5)
WBC: 8.3 10*3/uL (ref 4.0–10.5)
nRBC: 0 % (ref 0.0–0.2)

## 2021-01-09 LAB — ABO/RH: ABO/RH(D): AB POS

## 2021-01-09 MED ORDER — AMMONIA AROMATIC IN INHA
RESPIRATORY_TRACT | Status: AC
  Filled 2021-01-09: qty 10

## 2021-01-09 MED ORDER — IBUPROFEN 600 MG PO TABS
600.0000 mg | ORAL_TABLET | Freq: Four times a day (QID) | ORAL | Status: DC
  Administered 2021-01-09: 600 mg via ORAL
  Filled 2021-01-09: qty 1

## 2021-01-09 MED ORDER — ACETAMINOPHEN 325 MG PO TABS: 650.0000 mg | ORAL_TABLET | ORAL | Status: DC | PRN

## 2021-01-09 MED ORDER — BUPIVACAINE HCL (PF) 0.25 % IJ SOLN
INTRAMUSCULAR | Status: DC | PRN
  Administered 2021-01-09: 5 mL via EPIDURAL
  Administered 2021-01-09: 3 mL via EPIDURAL

## 2021-01-09 MED ORDER — MISOPROSTOL 50MCG HALF TABLET: 50.0000 ug | ORAL_TABLET | ORAL | Status: DC | PRN

## 2021-01-09 MED ORDER — PHENYLEPHRINE 40 MCG/ML (10ML) SYRINGE FOR IV PUSH (FOR BLOOD PRESSURE SUPPORT): 80.0000 ug | PREFILLED_SYRINGE | INTRAVENOUS | Status: DC | PRN

## 2021-01-09 MED ORDER — OXYTOCIN-SODIUM CHLORIDE 30-0.9 UT/500ML-% IV SOLN: 2.5000 [IU]/h | INTRAVENOUS | Status: DC

## 2021-01-09 MED ORDER — TERBUTALINE SULFATE 1 MG/ML IJ SOLN: 0.2500 mg | Freq: Once | INTRAMUSCULAR | Status: DC | PRN

## 2021-01-09 MED ORDER — MISOPROSTOL 200 MCG PO TABS
ORAL_TABLET | ORAL | Status: AC
  Filled 2021-01-09: qty 4

## 2021-01-09 MED ORDER — BENZOCAINE-MENTHOL 20-0.5 % EX AERO: 1.0000 "application " | INHALATION_SPRAY | CUTANEOUS | Status: DC | PRN

## 2021-01-09 MED ORDER — OXYTOCIN-SODIUM CHLORIDE 30-0.9 UT/500ML-% IV SOLN
1.0000 m[IU]/min | INTRAVENOUS | Status: DC
  Filled 2021-01-09: qty 500

## 2021-01-09 MED ORDER — LIDOCAINE HCL (PF) 1 % IJ SOLN: 30.0000 mL | INTRAMUSCULAR | Status: DC | PRN

## 2021-01-09 MED ORDER — COCONUT OIL OIL
1.0000 "application " | TOPICAL_OIL | Status: DC | PRN
  Filled 2021-01-09 (×2): qty 120

## 2021-01-09 MED ORDER — LACTATED RINGERS IV SOLN: 500.0000 mL | INTRAVENOUS | Status: DC | PRN

## 2021-01-09 MED ORDER — DIPHENHYDRAMINE HCL 25 MG PO CAPS: 25.0000 mg | ORAL_CAPSULE | Freq: Four times a day (QID) | ORAL | Status: DC | PRN

## 2021-01-09 MED ORDER — IBUPROFEN 600 MG PO TABS
600.0000 mg | ORAL_TABLET | Freq: Four times a day (QID) | ORAL | Status: DC
  Administered 2021-01-10 (×3): 600 mg via ORAL
  Filled 2021-01-09 (×3): qty 1

## 2021-01-09 MED ORDER — SENNOSIDES-DOCUSATE SODIUM 8.6-50 MG PO TABS
2.0000 | ORAL_TABLET | Freq: Every day | ORAL | Status: DC
  Administered 2021-01-10: 11:00:00 2 via ORAL
  Filled 2021-01-09: qty 2

## 2021-01-09 MED ORDER — PRENATAL MULTIVITAMIN CH
1.0000 | ORAL_TABLET | Freq: Every day | ORAL | Status: DC
  Administered 2021-01-10: 11:00:00 1 via ORAL
  Filled 2021-01-09: qty 1

## 2021-01-09 MED ORDER — OXYTOCIN 10 UNIT/ML IJ SOLN
INTRAMUSCULAR | Status: AC
  Filled 2021-01-09: qty 2

## 2021-01-09 MED ORDER — LIDOCAINE HCL (PF) 1 % IJ SOLN
INTRAMUSCULAR | Status: AC
  Filled 2021-01-09: qty 30

## 2021-01-09 MED ORDER — EPHEDRINE 5 MG/ML INJ: 10.0000 mg | INTRAVENOUS | Status: DC | PRN

## 2021-01-09 MED ORDER — SOD CITRATE-CITRIC ACID 500-334 MG/5ML PO SOLN: 30.0000 mL | ORAL | Status: DC | PRN

## 2021-01-09 MED ORDER — LACTATED RINGERS IV SOLN
500.0000 mL | Freq: Once | INTRAVENOUS | Status: AC
  Administered 2021-01-09: 500 mL via INTRAVENOUS

## 2021-01-09 MED ORDER — DIBUCAINE (PERIANAL) 1 % EX OINT: 1.0000 "application " | TOPICAL_OINTMENT | CUTANEOUS | Status: DC | PRN

## 2021-01-09 MED ORDER — ONDANSETRON HCL 4 MG/2ML IJ SOLN: 4.0000 mg | Freq: Four times a day (QID) | INTRAMUSCULAR | Status: DC | PRN

## 2021-01-09 MED ORDER — LIDOCAINE-EPINEPHRINE (PF) 1.5 %-1:200000 IJ SOLN
INTRAMUSCULAR | Status: DC | PRN
  Administered 2021-01-09: 4 mL via EPIDURAL

## 2021-01-09 MED ORDER — LACTATED RINGERS IV SOLN: INTRAVENOUS | Status: DC

## 2021-01-09 MED ORDER — ONDANSETRON HCL 4 MG/2ML IJ SOLN: 4.0000 mg | INTRAMUSCULAR | Status: DC | PRN

## 2021-01-09 MED ORDER — FENTANYL-BUPIVACAINE-NACL 0.5-0.125-0.9 MG/250ML-% EP SOLN
EPIDURAL | Status: AC
  Filled 2021-01-09: qty 250

## 2021-01-09 MED ORDER — OXYTOCIN BOLUS FROM INFUSION
333.0000 mL | Freq: Once | INTRAVENOUS | Status: AC
  Administered 2021-01-09: 333 mL via INTRAVENOUS

## 2021-01-09 MED ORDER — LIDOCAINE HCL (PF) 1 % IJ SOLN
INTRAMUSCULAR | Status: DC | PRN
  Administered 2021-01-09: 3 mL via SUBCUTANEOUS

## 2021-01-09 MED ORDER — ONDANSETRON HCL 4 MG PO TABS
4.0000 mg | ORAL_TABLET | ORAL | Status: DC | PRN
  Filled 2021-01-09: qty 1

## 2021-01-09 MED ORDER — OXYCODONE-ACETAMINOPHEN 5-325 MG PO TABS: 2.0000 | ORAL_TABLET | ORAL | Status: DC | PRN

## 2021-01-09 MED ORDER — WITCH HAZEL-GLYCERIN EX PADS: 1.0000 "application " | MEDICATED_PAD | CUTANEOUS | Status: DC | PRN

## 2021-01-09 MED ORDER — FENTANYL-BUPIVACAINE-NACL 0.5-0.125-0.9 MG/250ML-% EP SOLN
12.0000 mL/h | EPIDURAL | Status: DC | PRN
  Administered 2021-01-09: 12 mL/h via EPIDURAL

## 2021-01-09 MED ORDER — ZOLPIDEM TARTRATE 5 MG PO TABS: 5.0000 mg | ORAL_TABLET | Freq: Every evening | ORAL | Status: DC | PRN

## 2021-01-09 MED ORDER — OXYCODONE-ACETAMINOPHEN 5-325 MG PO TABS: 1.0000 | ORAL_TABLET | ORAL | Status: DC | PRN

## 2021-01-09 MED ORDER — ACETAMINOPHEN 325 MG PO TABS
650.0000 mg | ORAL_TABLET | ORAL | Status: DC | PRN
  Administered 2021-01-10 (×2): 650 mg via ORAL
  Filled 2021-01-09 (×2): qty 2

## 2021-01-09 MED ORDER — SIMETHICONE 80 MG PO CHEW: 80.0000 mg | CHEWABLE_TABLET | ORAL | Status: DC | PRN

## 2021-01-09 MED ORDER — DIPHENHYDRAMINE HCL 50 MG/ML IJ SOLN: 12.5000 mg | INTRAMUSCULAR | Status: DC | PRN

## 2021-01-09 NOTE — Anesthesia Preprocedure Evaluation (Signed)
Anesthesia Evaluation  Patient identified by MRN, date of birth, ID band Patient awake    Reviewed: Allergy & Precautions, H&P , NPO status , Patient's Chart, lab work & pertinent test results, reviewed documented beta blocker date and time   Airway Mallampati: II  TM Distance: >3 FB Neck ROM: full    Dental no notable dental hx. (+) Teeth Intact   Pulmonary    Pulmonary exam normal breath sounds clear to auscultation       Cardiovascular Exercise Tolerance: Good negative cardio ROS   Rhythm:regular Rate:Normal     Neuro/Psych negative neurological ROS  negative psych ROS   GI/Hepatic negative GI ROS, Neg liver ROS,   Endo/Other  negative endocrine ROSdiabetes  Renal/GU      Musculoskeletal   Abdominal   Peds  Hematology negative hematology ROS (+)   Anesthesia Other Findings   Reproductive/Obstetrics (+) Pregnancy                             Anesthesia Physical Anesthesia Plan  ASA: 2  Anesthesia Plan: Epidural   Post-op Pain Management:    Induction:   PONV Risk Score and Plan:   Airway Management Planned:   Additional Equipment:   Intra-op Plan:   Post-operative Plan:   Informed Consent: I have reviewed the patients History and Physical, chart, labs and discussed the procedure including the risks, benefits and alternatives for the proposed anesthesia with the patient or authorized representative who has indicated his/her understanding and acceptance.       Plan Discussed with:   Anesthesia Plan Comments:         Anesthesia Quick Evaluation  

## 2021-01-09 NOTE — Anesthesia Procedure Notes (Signed)
Epidural Patient location during procedure: OB  Staffing Performed: anesthesiologist   Preanesthetic Checklist Completed: patient identified, IV checked, site marked, risks and benefits discussed, surgical consent, monitors and equipment checked, pre-op evaluation and timeout performed  Epidural Patient position: sitting Prep: Betadine Patient monitoring: heart rate, continuous pulse ox and blood pressure Approach: midline Location: L4-L5 Injection technique: LOR air  Needle:  Needle type: Tuohy  Needle gauge: 18 G Needle length: 9 cm and 9 Needle insertion depth: 7 cm Catheter type: closed end flexible Catheter size: 20 Guage Catheter at skin depth: 13 cm Test dose: negative and 1.5% lidocaine with Epi 1:200 K  Assessment Sensory level: T10 Events: blood not aspirated, injection not painful, no injection resistance, no paresthesia and negative IV test  Additional Notes Pt's history reviewed and consent obtained as per OB consent Patient tolerated the insertion well without complications. Negative SATD, negative IVTD All VSS were obtained and monitored through OBIX and nursing protocols followed.Reason for block:procedure for pain

## 2021-01-09 NOTE — Progress Notes (Signed)
Intrapartum Progress Note  S: Patient feeling pressure.  O: Blood pressure 110/83, pulse 76, temperature 97.6 F (36.4 C), temperature source Oral, resp. rate 17, height 5\' 4"  (1.626 m), weight 105.7 kg, last menstrual period 04/10/2020, SpO2 99 %. Gen App: NAD, comfortable Abdomen: soft, gravid FHT: baseline 125 bpm.  Accels present.  Decels present - variable, intermittent . moderate in degree variability.   Tocometer: contractions q 2-3 minutes Cervix: 9/100/0 Extremities: Nontender, no edema.  Pitocin: None  Labs: No labs   Assessment:  1: SIUP at [redacted]w[redacted]d 2. Obesity in pregnancy  Plan:  1. Expectant management. Anticipate vaginal delivery soon.    [redacted]w[redacted]d, MD 01/09/2021 1:59 PM

## 2021-01-09 NOTE — OB Triage Note (Signed)
Pt arrives to triage as a G1P0 at $0 weeks 2 days. Pt had foley bulb placed Friday afternoon which came out after approximately 2 1/2 hours. Pt is having ctx s at this time and has bloody show upon cervical check.

## 2021-01-09 NOTE — H&P (Signed)
Obstetric History and Physical  Amy Oconnor is a 30 y.o. G1P0 with IUP at [redacted]w[redacted]d presenting for complaints of contractions since yesterday.  Was seen in the office yesterday for outpatient foley bulb insertion for scheduled IOL for obesity (BMI >40) on 12/4.  Patient states she has been having  regular, every 5 minutes contractions, minimal vaginal bleeding (with bloody show), intact membranes, with active fetal movement.    Prenatal Course Source of Care: [facility]  with onset of care at 10 weeks Pregnancy complications or risks: Patient Active Problem List   Diagnosis Date Noted   Obesity in pregnancy, antepartum 01/09/2021   She plans to breastfeed She desires  unsure method  for postpartum contraception.   Prenatal labs and studies: ABO, Rh: --/--/AB POS Performed at [facility], [address] [place], [place] [address]  [phone]) Antibody: NEG (12/03 0713) Rubella: 1.30 (05/13 1058) RPR: Non Reactive (09/08 1052)  HBsAg: Negative (05/13 1058)  HIV: Non Reactive (05/13 1058)  HKV:QQVZDGLO/-- (11/09 1523) 1 hr Glucola  normal Genetic screening normal Anatomy US normal   Past Medical History:  Diagnosis Date   Medical history non-contributory     Past Surgical History:  Procedure Laterality Date   LIPOSUCTION  2021   WISDOM TOOTH EXTRACTION      OB History  Gravida Para Term Preterm AB Living  1            SAB IAB Ectopic Multiple Live Births               # Outcome Date GA Lbr Len/2nd Weight Sex Delivery Anes PTL Lv  1 Current             Social History   Socioeconomic History   Marital status: Single    Spouse name: Not on file   Number of children: Not on file   Years of education: Not on file   Highest education level: Not on file  Occupational History   Not on file  Tobacco Use   Smoking status: Never   Smokeless tobacco: Never  Vaping Use   Vaping Use: Never used  Substance and Sexual Activity   Alcohol  use: Not Currently   Drug use: Not Currently    Types: Marijuana   Sexual activity: Not Currently    Partners: Male    Comment: Undecided  Other Topics Concern   Not on file  Social History Narrative   Not on file   Social Determinants of Health   Financial Resource Strain: Not on file  Food Insecurity: Not on file  Transportation Needs: Not on file  Physical Activity: Not on file  Stress: Not on file  Social Connections: Not on file    Family History  Problem Relation Age of Onset   Hypertension Mother    Diabetes Maternal Grandfather     Medications Prior to Admission  Medication Sig Dispense Refill Last Dose   calcium carbonate (TUMS - DOSED IN MG ELEMENTAL CALCIUM) 500 MG chewable tablet Chew 1 tablet by mouth daily.   01/08/2021   Prenatal Vit-Fe Fumarate-FA (MULTIVITAMIN-PRENATAL) 27-0.8 MG TABS tablet Take 1 tablet by mouth daily at 12 noon.   01/08/2021    No Known Allergies  Review of Systems: Negative except for what is mentioned in HPI.  Physical Exam: BP 109/85 (BP Location: Right Arm)   Pulse 87   Temp 97.9 F (36.6 C) (Oral)   Resp 18   Ht 5'  4" (1.626 m)   Wt 105.7 kg   LMP 04/10/2020   SpO2 99%   BMI 39.99 kg/m  CONSTITUTIONAL: Well-developed, well-nourished female in no acute distress.  HENT:  Normocephalic, atraumatic, External right and left ear normal. Oropharynx is clear and moist EYES: Conjunctivae and EOM are normal. Pupils are equal, round, and reactive to light. No scleral icterus.  NECK: Normal range of motion, supple, no masses SKIN: Skin is warm and dry. No rash noted. Not diaphoretic. No erythema. No pallor. NEUROLOGIC: Alert and oriented to person, place, and time. Normal reflexes, muscle tone coordination. No cranial nerve deficit noted. PSYCHIATRIC: Normal mood and affect. Normal behavior. Normal judgment and thought content. CARDIOVASCULAR: Normal heart rate noted, regular rhythm RESPIRATORY: Effort and breath sounds normal, no  problems with respiration noted ABDOMEN: Soft, nontender, nondistended, gravid. MUSCULOSKELETAL: Normal range of motion. No edema and no tenderness. 2+ distal pulses.  Cervical Exam: Dilatation 4.5 cm   Effacement 80%   Station - 2 Presentation: cephalic FHT:  Baseline rate 140 bpm   Variability moderate  Accelerations present   Decelerations none Contractions: Every 1-2 mins   Pertinent Labs/Studies:   Results for orders placed or performed during the hospital encounter of 01/09/21 (from the past 24 hour(s))  Type and screen     Status: None   Collection Time: 01/09/21  7:13 AM  Result Value Ref Range   ABO/RH(D) AB POS    Antibody Screen NEG    Sample Expiration      01/12/2021,2359 Performed at [facility], [address] [place], [place] [address]   CBC     Status: Abnormal   Collection Time: 01/09/21  7:30 AM  Result Value Ref Range   WBC 8.3 4.0 - 10.5 K/uL   RBC 4.18 3.87 - 5.11 MIL/uL   Hemoglobin 11.3 (L) 12.0 - 15.0 g/dL   HCT 03.1 (L) 59.4 - 58.5 %   MCV 83.7 80.0 - 100.0 fL   MCH 27.0 26.0 - 34.0 pg   MCHC 32.3 30.0 - 36.0 g/dL   RDW 92.9 24.4 - 62.8 %   Platelets 228 150 - 400 K/uL   nRBC 0.0 0.0 - 0.2 %  ABO/Rh     Status: None   Collection Time: 01/09/21  9:32 AM  Result Value Ref Range   ABO/RH(D)      AB POS Performed at [facility], [address] [place], [place] [address]     Assessment : Garland Smouse is a 30 y.o. G1P0 at [redacted]w[redacted]d being admitted for of labor. Obesity in pregnancy.   Plan: Labor: AROM'd with slight meconium tinged fluid. Can augment with Pitocin as needed. Has epidural in place.  FWB: Reassuring fetal heart tracing.  GBS negative Delivery plan: Hopeful for vaginal delivery    Hildred Laser, MD [facility]

## 2021-01-10 LAB — RPR: RPR Ser Ql: NONREACTIVE

## 2021-01-10 LAB — CBC
HCT: 30.3 % — ABNORMAL LOW (ref 36.0–46.0)
Hemoglobin: 9.9 g/dL — ABNORMAL LOW (ref 12.0–15.0)
MCH: 27.3 pg (ref 26.0–34.0)
MCHC: 32.7 g/dL (ref 30.0–36.0)
MCV: 83.7 fL (ref 80.0–100.0)
Platelets: 196 10*3/uL (ref 150–400)
RBC: 3.62 MIL/uL — ABNORMAL LOW (ref 3.87–5.11)
RDW: 13.7 % (ref 11.5–15.5)
WBC: 9.4 10*3/uL (ref 4.0–10.5)
nRBC: 0 % (ref 0.0–0.2)

## 2021-01-10 MED ORDER — MENTHOL 3 MG MT LOZG
1.0000 | LOZENGE | OROMUCOSAL | Status: DC | PRN
  Filled 2021-01-10: qty 9

## 2021-01-10 MED ORDER — IBUPROFEN 600 MG PO TABS: 600.0000 mg | ORAL_TABLET | Freq: Four times a day (QID) | ORAL | 1 refills | Status: DC

## 2021-01-10 NOTE — Progress Notes (Signed)
Patient discharged with infant. Discharge instructions, prescriptions, and follow up appointments given to and reviewed with patient. Patient verbalized understanding. Will be escorted out by axillary.  °

## 2021-01-10 NOTE — Discharge Summary (Signed)
Postpartum Discharge Summary     Patient Name: Amy Oconnor DOB: [birth year] MRN: [id number]  Date of admission: 01/09/2021 Delivery date:01/09/2021  Delivering provider: Rubie Maid  Date of discharge: 01/10/2021  Admitting diagnosis: Obesity in pregnancy, antepartum [O99.210] Intrauterine pregnancy: [redacted]w[redacted]d    Secondary diagnosis:  Principal Problem:   Obesity in pregnancy, antepartum  Additional problems: None    Discharge diagnosis: Term Pregnancy Delivered                                              Post partum procedures: None Augmentation: AROM and OP Foley Complications: None  Hospital course: Induction of Labor With Vaginal Delivery   30y.o. yo G1P1001 at 435w2das admitted to the hospital 01/09/2021 for induction of labor.  Indication for induction: Postdates and Obesity in pregnancy .  Patient had an uncomplicated labor course as follows: Membrane Rupture Time/Date: 10:37 AM ,01/09/2021   Delivery Method:Vaginal, Spontaneous  Episiotomy: None  Lacerations:  1st degree  Details of delivery can be found in separate delivery note.  Patient had a routine postpartum course. Patient is discharged home 01/11/21.  Newborn Data: Birth date:01/09/2021  Birth time:2:47 PM  Gender:Female  Living status:Living  Apgars:9 ,9  Weight:3090 g   Magnesium Sulfate received: No BMZ received: No Rhophylac:No MMR:No T-DaP:Given prenatally Flu: Given prenatally Transfusion:No  Physical exam  Vitals:   01/09/21 2251 01/10/21 0008 01/10/21 0351 01/10/21 0855  BP: 121/70 118/76 (!) 112/59 111/66  Pulse: 70 80 72 81  Resp: _0 Temp: 98.4 F (36.9 C) 97.9 F (36.6 C) 98.2 F (36.8 C) 97.9 F (36.6 C)  TempSrc: Oral Oral Oral Oral  SpO2: 96% 97% 98% 97%  Weight:      Height:       General: alert, cooperative, and no distress Lochia: appropriate Uterine Fundus: firm Incision: N/A DVT Evaluation: No evidence of DVT seen on physical exam. Negative Homan's  sign. No cords or calf tenderness. No significant calf/ankle edema. Labs: Lab Results  Component Value Date   WBC 9.4 01/10/2021   HGB 9.9 (L) 01/10/2021   HCT 30.3 (L) 01/10/2021   MCV 83.7 01/10/2021   PLT 196 01/10/2021   No flowsheet data found. Edinburgh Score: Edinburgh Postnatal Depression Scale Screening Tool 01/10/2021  I have been able to laugh and see the funny side of things. 0  I have looked forward with enjoyment to things. 0  I have blamed myself unnecessarily when things went wrong. 0  I have been anxious or worried for no good reason. 0  I have felt scared or panicky for no good reason. 0  Things have been getting on top of me. 0  I have been so unhappy that I have had difficulty sleeping. 0  I have felt sad or miserable. 0  I have been so unhappy that I have been crying. 0  The thought of harming myself has occurred to me. 0  Edinburgh Postnatal Depression Scale Total 0      After visit meds:  Allergies as of 01/10/2021   No Known Allergies      Medication List     TAKE these medications    calcium carbonate 500 MG chewable tablet Commonly known as: TUMS - dosed in mg elemental calcium Chew 1 tablet by mouth daily.   ibuprofen 600 MG  tablet Commonly known as: ADVIL Take 1 tablet (600 mg total) by mouth every 6 (six) hours.   multivitamin-prenatal 27-0.8 MG Tabs tablet Take 1 tablet by mouth daily at 12 noon.         Discharge home in stable condition Infant Feeding: Breast Infant Disposition:home with mother Discharge instruction: per After Visit Summary and Postpartum booklet. Activity: Advance as tolerated. Pelvic rest for 6 weeks.  Diet: routine diet Anticipated Birth Control: Unsure Postpartum Appointment:6 weeks Additional Postpartum F/U: Postpartum Depression checkup in 2 weeks (televisit) Future Appointments:No future appointments. Follow up Visit:  Follow-up Information     Rubie Maid, MD Follow up.   Specialties:  Obstetrics and Gynecology, Radiology Why: 2 week postpartum televisit mood check 6 week postpartum visit in office Contact information: [address] [place] [address] [phone]                     01/10/2021 Rubie Maid, MD

## 2021-01-10 NOTE — Progress Notes (Signed)
Post Partum Day # 1, s/p SVD  Subjective: no complaints, up ad lib, voiding, and tolerating PO. Breastfeeding is going well. Pain is controlled, is minimal at this time. Bleeding is normal.   Objective: Temp:  [97.6 F (36.4 C)-98.6 F (37 C)] 98.2 F (36.8 C) (12/04 0351) Pulse Rate:  [70-156] 72 (12/04 0351) Resp:  [16-20] 18 (12/04 0351) BP: (98-133)/(46-89) 112/59 (12/04 0351) SpO2:  [96 %-100 %] 98 % (12/04 0351) Weight:  [105.7 kg] 105.7 kg (12/03 0659)  Physical Exam:  General: alert and no distress  Lungs: clear to auscultation bilaterally Breasts: normal appearance, no masses or tenderness Heart: regular rate and rhythm, S1, S2 normal, no murmur, click, rub or gallop Abdomen: soft, non-tender; bowel sounds normal; no masses,  no organomegaly Pelvis: Lochia: appropriate, Uterine Fundus: firm Extremities: DVT Evaluation: No evidence of DVT seen on physical exam. Negative Homan's sign. No cords or calf tenderness. No significant calf/ankle edema.  Recent Labs    01/09/21 0730 01/10/21 0602  HGB 11.3* 9.9*  HCT 35.0* 30.3*    Assessment/Plan: Doing well postpartum  Breastfeeding, Lactation consult Circumcision prior to discharge and Contraception  Anemia of pregnancy (iron deficiency)   LOS: 1 day   Hildred Laser, MD [facility] 01/10/2021 6:30 AM

## 2021-01-11 DIAGNOSIS — O480 Post-term pregnancy: Secondary | ICD-10-CM

## 2021-01-11 NOTE — Anesthesia Postprocedure Evaluation (Signed)
Anesthesia Post Note  Patient: Amy Oconnor  Procedure(s) Performed: AN AD HOC LABOR EPIDURAL  Patient location during evaluation: PACU Anesthesia Type: Epidural Level of consciousness: awake and alert Pain management: pain level controlled Vital Signs Assessment: post-procedure vital signs reviewed and stable Respiratory status: spontaneous breathing, nonlabored ventilation, respiratory function stable and patient connected to nasal cannula oxygen Cardiovascular status: blood pressure returned to baseline and stable Postop Assessment: no apparent nausea or vomiting Anesthetic complications: no   No notable events documented.   Last Vitals: There were no vitals filed for this visit.  Last Pain: There were no vitals filed for this visit.               Yevette Edwards

## 2021-01-27 ENCOUNTER — Encounter: Payer: Self-pay | Admitting: Obstetrics and Gynecology

## 2021-01-27 ENCOUNTER — Telehealth (INDEPENDENT_AMBULATORY_CARE_PROVIDER_SITE_OTHER): Payer: BC Managed Care – PPO | Admitting: Obstetrics and Gynecology

## 2021-01-27 VITALS — Ht 64.0 in | Wt 233.0 lb

## 2021-01-27 DIAGNOSIS — Z1332 Encounter for screening for maternal depression: Secondary | ICD-10-CM

## 2021-01-27 NOTE — Patient Instructions (Signed)

## 2021-01-27 NOTE — Progress Notes (Signed)
°  Virtual Visit via Video Note   I connected with Amy Oconnor at 09:30 AM EST by a video enabled telemedicine application and verified that I am speaking with the correct person using two identifiers.   Location: Patient: Home Provider: Office   I discussed the limitations of evaluation and management by telemedicine and the availability of in person appointments. The patient expressed understanding and agreed to proceed.   History of Present Illness:   Amy Oconnor is a 30 y.o. y.o. G43P1001 female who presents for a 2 week televisit for postpartum mood check. She is 2 week postpartum following a spontaneous vaginal delivery.  The delivery was at 40  gestational weeks.  Postpartum course has been well so far. Baby is feeding by breast and formula.  Also using breast pump. Producing ~ 7-9 oz. Notes breastfeeding is going well. Pumping every 3 hours. Denies s/s of mastitis. Bleeding: patient notes bleeding had stopped for 1-2 days but then resumed again, mostly spotting with wiping.  Postpartum depression screening: negative.  EDPS score is 0.      The following portions of the patient's history were reviewed and updated as appropriate: allergies, current medications, past family history, past medical history, past social history, past surgical history, and problem list.   Observations/Objective:   Height 5\' 4"  (1.626 m), weight 233 lb 0.4 oz (105.7 kg), currently breastfeeding.  Gen App: NAD Psych: normal speech, affect. Good mood.     Edinburgh Postnatal Depression Scale Screening Tool 01/27/2021 01/10/2021 01/09/2021  I have been able to laugh and see the funny side of things. 0 0 (No Data)  I have looked forward with enjoyment to things. 0 0 -  I have blamed myself unnecessarily when things went wrong. 0 0 -  I have been anxious or worried for no good reason. 0 0 -  I have felt scared or panicky for no good reason. 0 0 -  Things have been getting on top of me. 0 0 -  I have  been so unhappy that I have had difficulty sleeping. 0 0 -  I have felt sad or miserable. 0 0 -  I have been so unhappy that I have been crying. 0 0 -  The thought of harming myself has occurred to me. 0 0 -  Edinburgh Postnatal Depression Scale Total 0 0 -        Assessment and Plan:   1. Encounter for screening for maternal depression - Screening negative today. Will rescreen at 6 week postpartum visit. Overall doing well.    2. Postpartum state - Overall doing well. Continue routine postpartum home care.    3. Lactating mother - Currently feeding via breast and bottle. Working to increase milk supply, plans to begin OTC supplements    Follow Up Instructions:     I discussed the assessment and treatment plan with the patient. The patient was provided an opportunity to ask questions and all were answered. The patient agreed with the plan and demonstrated an understanding of the instructions.   The patient was advised to call back or seek an in-person evaluation if the symptoms worsen or if the condition fails to improve as anticipated.   I provided 9 minutes of non-face-to-face time during this encounter.     14/04/2020, MD [facility]

## 2021-02-23 ENCOUNTER — Other Ambulatory Visit: Payer: Self-pay

## 2021-02-23 ENCOUNTER — Encounter: Payer: Self-pay | Admitting: Obstetrics and Gynecology

## 2021-02-23 ENCOUNTER — Ambulatory Visit: Payer: BC Managed Care – PPO | Admitting: Obstetrics and Gynecology

## 2021-02-23 DIAGNOSIS — Z30013 Encounter for initial prescription of injectable contraceptive: Secondary | ICD-10-CM

## 2021-02-23 DIAGNOSIS — O9081 Anemia of the puerperium: Secondary | ICD-10-CM

## 2021-02-23 MED ORDER — MEDROXYPROGESTERONE ACETATE 150 MG/ML IM SUSP: 150.0000 mg | INTRAMUSCULAR | 3 refills | Status: AC

## 2021-02-23 NOTE — Patient Instructions (Signed)
Medroxyprogesterone Injection (Contraception) °What is this medication? °MEDROXYPROGESTERONE (me DROX ee proe JES te rone) prevents ovulation and pregnancy. It belongs to a group of medications called contraceptives. This medication is a progestin hormone. °This medicine may be used for other purposes; ask your health care provider or pharmacist if you have questions. °COMMON BRAND NAME(S): Depo-Provera, Depo-subQ Provera 104 °What should I tell my care team before I take this medication? °They need to know if you have any of these conditions: °Asthma °Blood clots °Breast cancer or family history of breast cancer °Depression °Diabetes °Eating disorder (anorexia nervosa) °Heart attack °High blood pressure °HIV infection or AIDS °If you often drink alcohol °Kidney disease °Liver disease °Migraine headaches °Osteoporosis, weak bones °Seizures °Stroke °Tobacco smoker °Vaginal bleeding °An unusual or allergic reaction to medroxyprogesterone, other hormones, medications, foods, dyes, or preservatives °Pregnant or trying to get pregnant °Breast-feeding °How should I use this medication? °Depo-Provera CI contraceptive injection is given into a muscle. Depo-subQ Provera 104 injection is given under the skin. It is given in a hospital or clinic setting. The injection is usually given during the first 5 days after the start of a menstrual period or 6 weeks after delivery of a baby. °A patient package insert for the product will be given with each prescription and refill. Be sure to read this information carefully each time. The sheet may change often. °Talk to your care team about the use of this medication in children. Special care may be needed. These injections have been used in female children who have started having menstrual periods. °Overdosage: If you think you have taken too much of this medicine contact a poison control center or emergency room at once. °NOTE: This medicine is only for you. Do not share this medicine  with others. °What if I miss a dose? °Keep appointments for follow-up doses. You must get an injection once every 3 months. It is important not to miss your dose. Call your care team if you are unable to keep an appointment. °What may interact with this medication? °Antibiotics or medications for infections, especially rifampin and griseofulvin °Antivirals for HIV or hepatitis °Aprepitant °Armodafinil °Bexarotene °Bosentan °Medications for seizures like carbamazepine, felbamate, oxcarbazepine, phenytoin, phenobarbital, primidone, topiramate °Mitotane °Modafinil °St. John's wort °This list may not describe all possible interactions. Give your health care provider a list of all the medicines, herbs, non-prescription drugs, or dietary supplements you use. Also tell them if you smoke, drink alcohol, or use illegal drugs. Some items may interact with your medicine. °What should I watch for while using this medication? °This medication does not protect you against HIV infection (AIDS) or other sexually transmitted diseases. °Use of this product may cause you to lose calcium from your bones. Loss of calcium may cause weak bones (osteoporosis). Only use this product for more than 2 years if other forms of birth control are not right for you. The longer you use this product for birth control the more likely you will be at risk for weak bones. Ask your care team how you can keep strong bones. °You may have a change in bleeding pattern or irregular periods. Many females stop having periods while taking this medication. °If you have received your injections on time, your chance of being pregnant is very low. If you think you may be pregnant, see your care team as soon as possible. °Tell your care team if you want to get pregnant within the next year. The effect of this medication may last a   long time after you get your last injection. °What side effects may I notice from receiving this medication? °Side effects that you should  report to your care team as soon as possible: °Allergic reactions--skin rash, itching, hives, swelling of the face, lips, tongue, or throat °Blood clot--pain, swelling, or warmth in the leg, shortness of breath, chest pain °Gallbladder problems--severe stomach pain, nausea, vomiting, fever °Increase in blood pressure °Liver injury--right upper belly pain, loss of appetite, nausea, light-colored stool, dark yellow or brown urine, yellowing skin or eyes, unusual weakness or fatigue °New or worsening migraines or headaches °Seizures °Stroke--sudden numbness or weakness of the face, arm, or leg, trouble speaking, confusion, trouble walking, loss of balance or coordination, dizziness, severe headache, change in vision °Unusual vaginal discharge, itching, or odor °Worsening mood, feelings of depression °Side effects that usually do not require medical attention (report to your care team if they continue or are bothersome): °Breast pain or tenderness °Dark patches of the skin on the face or other sun-exposed areas °Irregular menstrual cycles or spotting °Nausea °Weight gain °This list may not describe all possible side effects. Call your doctor for medical advice about side effects. You may report side effects to FDA at 1-800-FDA-1088. °Where should I keep my medication? °This injection is only given by a care team. It will not be stored at home. °NOTE: This sheet is a summary. It may not cover all possible information. If you have questions about this medicine, talk to your doctor, pharmacist, or health care provider. °© 2022 [employer]/Gold Standard (2020-03-29 00:00:00) ° °

## 2021-02-23 NOTE — Progress Notes (Signed)
° °  OBSTETRICS POSTPARTUM CLINIC PROGRESS NOTE  Subjective:     Amy Oconnor is a 31 y.o. G21P1001 female who presents for a postpartum visit. She is 6 weeks postpartum following a spontaneous vaginal delivery. I have fully reviewed the prenatal and intrapartum course. The delivery was at 40.2 gestational weeks.  Anesthesia: epidural. Postpartum course has been well. Baby's course has been well. Baby is feeding by bottle - Enfamil Neuro Gentle Ease . Bleeding: patient has not not resumed menses, with No LMP recorded. Bowel function is normal. Bladder function is normal. Patient is not sexually active. Contraception method desired is Depo-Provera injections. Postpartum depression screening: negative.  PHQ-9 score is 1.    The following portions of the patient's history were reviewed and updated as appropriate: allergies, current medications, past family history, past medical history, past social history, past surgical history, and problem list.  Review of Systems Pertinent items noted in HPI and remainder of comprehensive ROS otherwise negative.   Objective:    BP 121/84    Pulse 96    Ht 5\' 4"  (1.626 m)    Wt 224 lb (101.6 kg)    SpO2 98%    Breastfeeding No Comment: Formula--Infamil (gentle)   BMI 38.45 kg/m   General:  alert and no distress   Breasts:  inspection negative, no nipple discharge or bleeding, no masses or nodularity palpable  Lungs: clear to auscultation bilaterally  Heart:  regular rate and rhythm, S1, S2 normal, no murmur, click, rub or gallop  Abdomen: soft, non-tender; bowel sounds normal; no masses,  no organomegaly.     Vulva:  normal  Vagina: normal vagina, no discharge, exudate, lesion, or erythema  Cervix:  no cervical motion tenderness and no lesions  Corpus: normal size, contour, position, consistency, mobility, non-tender  Adnexa:  normal adnexa and no mass, fullness, tenderness  Rectal Exam: Not performed.          Labs:  Lab Results  Component Value Date    HGB 9.9 (L) 01/10/2021     Assessment:   1. Postpartum care following vaginal delivery   2. Anemia, postpartum   3. Encounter for initial prescription of injectable contraceptive      Plan:    1. Contraception: Depo-Provera injections 2. Mild anemia postpartum, asymptomatic.  3. Follow up in:  4-6 months for annual exam, within the next week for Depo Provera injection.      14/05/2020, MD [facility]

## 2021-03-01 ENCOUNTER — Other Ambulatory Visit: Payer: Self-pay

## 2021-03-01 ENCOUNTER — Ambulatory Visit (INDEPENDENT_AMBULATORY_CARE_PROVIDER_SITE_OTHER): Payer: BC Managed Care – PPO | Admitting: Obstetrics and Gynecology

## 2021-03-01 VITALS — Ht 64.0 in | Wt 226.0 lb

## 2021-03-01 DIAGNOSIS — Z3042 Encounter for surveillance of injectable contraceptive: Secondary | ICD-10-CM

## 2021-03-01 MED ORDER — MEDROXYPROGESTERONE ACETATE 150 MG/ML IM SUSP
150.0000 mg | Freq: Once | INTRAMUSCULAR | Status: AC
  Administered 2021-03-01: 150 mg via INTRAMUSCULAR

## 2021-03-01 NOTE — Progress Notes (Signed)
Administered first injection to pt- Medroxyprogesterone 150 mg on the left hip. Pt tolerated well.

## 2021-05-25 ENCOUNTER — Ambulatory Visit (INDEPENDENT_AMBULATORY_CARE_PROVIDER_SITE_OTHER): Payer: BC Managed Care – PPO | Admitting: Obstetrics and Gynecology

## 2021-05-25 ENCOUNTER — Encounter: Payer: Self-pay | Admitting: Obstetrics and Gynecology

## 2021-05-25 ENCOUNTER — Other Ambulatory Visit (HOSPITAL_COMMUNITY)
Admission: RE | Admit: 2021-05-25 | Discharge: 2021-05-25 | Disposition: A | Discharge status: Home / Self Care | Payer: BC Managed Care – PPO | Source: Ambulatory Visit | Attending: Obstetrics and Gynecology | Admitting: Obstetrics and Gynecology

## 2021-05-25 DIAGNOSIS — N898 Other specified noninflammatory disorders of vagina: Secondary | ICD-10-CM

## 2021-05-25 NOTE — Progress Notes (Signed)
HPI: ?     [name] is a 31 y.o. G1P1001 who LMP was Patient's last menstrual period was 05/03/2021. ? ?Subjective:  ? ?She presents today complaining of intermittent slight vaginal discharge occasionally with odor.  She has no particular concerns about her sexual partner but she would like to be tested for everything vaginal at this time. ?She does have a remote history of STI and BV and she says this seems to most resemble BV. ? ?  Hx: ?The following portions of the patient's history were reviewed and updated as appropriate: ?            She  has a past medical history of Medical history non-contributory. ?She does not have any pertinent problems on file. ?She  has a past surgical history that includes Liposuction (2021) and Wisdom tooth extraction. ?Her family history includes Diabetes in her maternal grandfather; Hypertension in her mother. ?She  reports that she has never smoked. She has never used smokeless tobacco. She reports that she does not currently use alcohol. She reports that she does not currently use drugs after having used the following drugs: Marijuana. ?She has a current medication list which includes the following prescription(s): medroxyprogesterone. ?She has No Known Allergies. ?      ?Review of Systems:  ?Review of Systems ? ?Constitutional: Denied constitutional symptoms, night sweats, recent illness, fatigue, fever, insomnia and weight loss.  ?Eyes: Denied eye symptoms, eye pain, photophobia, vision change and visual disturbance.  ?Ears/Nose/Throat/Neck: Denied ear, nose, throat or neck symptoms, hearing loss, nasal discharge, sinus congestion and sore throat.  ?Cardiovascular: Denied cardiovascular symptoms, arrhythmia, chest pain/pressure, edema, exercise intolerance, orthopnea and palpitations.  ?Respiratory: Denied pulmonary symptoms, asthma, pleuritic pain, productive sputum, cough, dyspnea and wheezing.  ?Gastrointestinal: Denied, gastro-esophageal reflux, melena, nausea  and vomiting.  ?Genitourinary: See HPI for additional information.  ?Musculoskeletal: Denied musculoskeletal symptoms, stiffness, swelling, muscle weakness and myalgia.  ?Dermatologic: Denied dermatology symptoms, rash and scar.  ?Neurologic: Denied neurology symptoms, dizziness, headache, neck pain and syncope.  ?Psychiatric: Denied psychiatric symptoms, anxiety and depression.  ?Endocrine: Denied endocrine symptoms including hot flashes and night sweats.  ? ?Meds: ?  ?Current Outpatient Medications on File Prior to Visit  ?Medication Sig Dispense Refill  ? medroxyPROGESTERone (DEPO-PROVERA) 150 MG/ML injection Inject 1 mL (150 mg total) into the muscle every 3 (three) months. 1 mL 3  ? ?No current facility-administered medications on file prior to visit.  ? ? ? ? ?Objective:  ?  ? ?There were no vitals filed for this visit. ?There were no vitals filed for this visit. ?  ?         Physical examination ?  Pelvic:   ?Vulva: Normal appearance.  No lesions.  ?Vagina: No lesions or abnormalities noted.  ?Support: Normal pelvic support.  ?Urethra No masses tenderness or scarring.  ?Meatus Normal size without lesions or prolapse.  ?Cervix: Normal appearance.  No lesions.  ?Anus: Normal exam.  No lesions.  ?Perineum: Normal exam.  No lesions.  ? ? ?        ? ?Assessment:  ?  ?G1P1001 ?Patient Active Problem List  ? Diagnosis Date Noted  ? Post-dates pregnancy 01/11/2021  ? Obesity in pregnancy, antepartum 01/09/2021  ? ?  ?1. Vaginal odor   ? ?  ? ? ?Plan:  ?  ?       ? 1.  NuSwab performed-we will contact patient with any abnormal results ?Orders ?No orders of the defined types were  placed in this encounter. ? ? No orders of the defined types were placed in this encounter. ?  ?  F/U ? Return for We will contact her with any abnormal test results. ?I spent 22 minutes involved in the care of this patient preparing to see the patient by obtaining and reviewing her medical history (including labs, imaging tests and prior  procedures), documenting clinical information in the electronic health record (EHR), counseling and coordinating care plans, writing and sending prescriptions, ordering tests or procedures and in direct communicating with the patient and medical staff discussing pertinent items from her history and physical exam. ? ?Elonda Husky, M.D. ?05/25/2021 ?3:01 PM ? ? ? ? ?

## 2021-05-25 NOTE — Progress Notes (Signed)
Patient presents today due to vaginal odor and a concerning moisture to her. She states she has not noticed a discharge and denies itching or burning. She states she would like to be tested for BV, yeast and potentially STD's. Patient states no other questions or concerns at this time. ?

## 2021-05-27 LAB — CERVICOVAGINAL ANCILLARY ONLY
Bacterial Vaginitis (gardnerella): POSITIVE — AB
Candida Glabrata: NEGATIVE
Candida Vaginitis: NEGATIVE
Chlamydia: NEGATIVE
Comment: NEGATIVE
Comment: NEGATIVE
Comment: NEGATIVE
Comment: NEGATIVE
Comment: NORMAL
Neisseria Gonorrhea: NEGATIVE

## 2021-05-28 ENCOUNTER — Ambulatory Visit (INDEPENDENT_AMBULATORY_CARE_PROVIDER_SITE_OTHER): Payer: BC Managed Care – PPO | Admitting: Obstetrics and Gynecology

## 2021-05-28 ENCOUNTER — Other Ambulatory Visit: Payer: Self-pay

## 2021-05-28 DIAGNOSIS — Z3042 Encounter for surveillance of injectable contraceptive: Secondary | ICD-10-CM | POA: Diagnosis not present

## 2021-05-28 DIAGNOSIS — B9689 Other specified bacterial agents as the cause of diseases classified elsewhere: Secondary | ICD-10-CM

## 2021-05-28 MED ORDER — METRONIDAZOLE 500 MG PO TABS: 500.0000 mg | ORAL_TABLET | Freq: Two times a day (BID) | ORAL | 0 refills | Status: DC

## 2021-05-28 MED ORDER — MEDROXYPROGESTERONE ACETATE 150 MG/ML IM SUSP
150.0000 mg | Freq: Once | INTRAMUSCULAR | Status: AC
  Administered 2021-05-28: 150 mg via INTRAMUSCULAR

## 2021-05-28 NOTE — Progress Notes (Signed)
Date last pap: n/a, scheduled for 06/15/21. ?Last Depo-Provera: 03/01/21. ?Side Effects if any: n/a. ?Serum HCG indicated? N/a. ?Depo-Provera 150 mg IM given by: Georgiana Shore, CMA. ?Next appointment due July 7-July 21. ? ?

## 2021-06-14 NOTE — Progress Notes (Signed)
? ? ?GYNECOLOGY ANNUAL PHYSICAL EXAM PROGRESS NOTE ? ?Subjective:  ? ? Denitra Ciak is a 31 y.o. G57P1001 female who presents for an annual exam. The patient has no complaints today. The patient is sexually active. The patient participates in regular exercise: yes. Has the patient ever been transfused or tattooed?: yes. The patient reports that there is not domestic violence in her life.  ? ? ?Menstrual History: ?Menarche age: 27 or 7 ?Patient's last menstrual period was 05/14/2021 (approximate). ?Period Duration (Days): 12 ?Period Pattern: (!) Irregular ?Menstrual Flow: Moderate ?Menstrual Control: Panty liner, Maxi pad, Tampon ?Menstrual Control Change Freq (Hours): 3-4 ?Dysmenorrhea: None ? ? ?Gynecologic History:  ?Contraception: Depo-Provera injections ?History of STI's: Denies ?Last Pap:Thinks she had one last year. Denies h/o abnormal pap smears. ? ? ? Upstream - 06/15/21 0914   ? ?  ? Pregnancy Intention Screening  ? Does the patient want to become pregnant in the next year? No   ? Does the patient's partner want to become pregnant in the next year? No   ? Would the patient like to discuss contraceptive options today? No   ?  ? Contraception Wrap Up  ? Current Method Hormonal Injection   ? End Method Hormonal Injection   ? Contraception Counseling Provided No   ? ?  ?  ? ?  ? ?The pregnancy intention screening data noted above was reviewed. Potential methods of contraception were discussed. The patient elected to proceed with Hormonal Injection. ? ? ? ?OB History  ?Gravida Para Term Preterm AB Living  ?1 1 1  0 0 1  ?SAB IAB Ectopic Multiple Live Births  ?0 0 0 0 1  ?  ?# Outcome Date GA Lbr Len/2nd Weight Sex Delivery Anes PTL Lv  ?1 Term 01/09/21 [redacted]w[redacted]d 11:55 / 00:37 6 lb 13 oz (3.09 kg) M Vag-Spont EPI  LIV  ?   Name: STARLEEN, COMELLA  ?   Apgar1: 9  Apgar5: 9  ? ? ?Past Medical History:  ?Diagnosis Date  ? Medical history non-contributory   ? ? ?Past Surgical History:  ?Procedure Laterality Date  ?  LIPOSUCTION  2021  ? WISDOM TOOTH EXTRACTION    ? ? ?Family History  ?Problem Relation Age of Onset  ? Hypertension Mother   ? Diabetes Maternal Grandfather   ? ? ?Social History  ? ?Socioeconomic History  ? Marital status: Single  ?  Spouse name: Not on file  ? Number of children: Not on file  ? Years of education: Not on file  ? Highest education level: Not on file  ?Occupational History  ? Not on file  ?Tobacco Use  ? Smoking status: Never  ? Smokeless tobacco: Never  ?Vaping Use  ? Vaping Use: Never used  ?Substance and Sexual Activity  ? Alcohol use: Not Currently  ? Drug use: Not Currently  ?  Types: Marijuana  ? Sexual activity: Yes  ?  Partners: Male  ?  Birth control/protection: Injection  ?Other Topics Concern  ? Not on file  ?Social History Narrative  ? Not on file  ? ?Social Determinants of Health  ? ?Financial Resource Strain: Not on file  ?Food Insecurity: Not on file  ?Transportation Needs: Not on file  ?Physical Activity: Not on file  ?Stress: Not on file  ?Social Connections: Not on file  ?Intimate Partner Violence: Not on file  ? ? ?Current Outpatient Medications on File Prior to Visit  ?Medication Sig Dispense Refill  ? medroxyPROGESTERone (DEPO-PROVERA) 150 MG/ML  injection Inject 1 mL (150 mg total) into the muscle every 3 (three) months. 1 mL 3  ? metroNIDAZOLE (FLAGYL) 500 MG tablet Take 1 tablet (500 mg total) by mouth 2 (two) times daily. 14 tablet 0  ? ?No current facility-administered medications on file prior to visit.  ? ? ?No Known Allergies ? ? ?Review of Systems ?Constitutional: negative for chills, fatigue, fevers and sweats ?Eyes: negative for irritation, redness and visual disturbance ?Ears, nose, mouth, throat, and face: negative for hearing loss, nasal congestion, snoring and tinnitus ?Respiratory: negative for asthma, cough, sputum ?Cardiovascular: negative for chest pain, dyspnea, exertional chest pressure/discomfort, irregular heart beat, palpitations and  syncope ?Gastrointestinal: negative for abdominal pain, change in bowel habits, nausea and vomiting ?Genitourinary: negative for abnormal menstrual periods, genital lesions, sexual problems and vaginal discharge, dysuria and urinary incontinence ?Integument/breast: negative for breast lump, breast tenderness and nipple discharge ?Hematologic/lymphatic: negative for bleeding and easy bruising ?Musculoskeletal:negative for back pain and muscle weakness ?Neurological: negative for dizziness, headaches, vertigo and weakness ?Endocrine: negative for diabetic symptoms including polydipsia, polyuria and skin dryness ?Allergic/Immunologic: negative for hay fever and urticaria    ? ? ?Objective:  ?Blood pressure 136/77, pulse 83, resp. rate 16, height 5\' 4"  (1.626 m), weight 217 lb 4.8 oz (98.6 kg), last menstrual period 05/14/2021, not currently breastfeeding. Body mass index is 37.3 kg/m?. ? ?General Appearance:    Alert, cooperative, no distress, appears stated age, moderate obesity  ?Head:    Normocephalic, without obvious abnormality, atraumatic  ?Eyes:    PERRL, conjunctiva/corneas clear, EOM's intact, both eyes  ?Ears:    Normal external ear canals, both ears  ?Nose:   Nares normal, septum midline, mucosa normal, no drainage or sinus tenderness  ?Throat:   Lips, mucosa, and tongue normal; teeth and gums normal  ?Neck:   Supple, symmetrical, trachea midline, no adenopathy; thyroid: no enlargement/tenderness/nodules; no carotid bruit or JVD  ?Back:     Symmetric, no curvature, ROM normal, no CVA tenderness  ?Lungs:     Clear to auscultation bilaterally, respirations unlabored  ?Chest Wall:    No tenderness or deformity  ? Heart:    Regular rate and rhythm, S1 and S2 normal, no murmur, rub or gallop  ?Breast Exam:    No tenderness, masses, or nipple abnormality  ?Abdomen:     Soft, non-tender, bowel sounds active all four quadrants, no masses, no organomegaly.    ?Genitalia:    Pelvic:external genitalia normal, vagina  without lesions, discharge, or tenderness, rectovaginal septum  normal. Cervix normal in appearance, no cervical motion tenderness, no adnexal masses or tenderness.  Uterus normal size, shape, mobile, regular contours, nontender.  ?Rectal:    Normal external sphincter.  No hemorrhoids appreciated. Internal exam not done.   ?Extremities:   Extremities normal, atraumatic, no cyanosis or edema  ?Pulses:   2+ and symmetric all extremities  ?Skin:   Skin color, texture, turgor normal, no rashes or lesions  ?Lymph nodes:   Cervical, supraclavicular, and axillary nodes normal  ?Neurologic:   CNII-XII intact, normal strength, sensation and reflexes throughout  ? ?. ? ?Labs:  ?Lab Results  ?Component Value Date  ? WBC 9.4 01/10/2021  ? HGB 9.9 (L) 01/10/2021  ? HCT 30.3 (L) 01/10/2021  ? MCV 83.7 01/10/2021  ? PLT 196 01/10/2021  ? ? ?No results found for: CREATININE, BUN, NA, K, CL, CO2 ? ?No results found for: ALT, AST, GGT, ALKPHOS, BILITOT ? ?Lab Results  ?Component Value Date  ? TSH  1.190 06/19/2020  ? ? ? ?Assessment:  ? ?1. Well woman exam with routine gynecological exam   ?2. Screening for lipid disorders   ?3. Cervical cancer screening   ?4. Obesity (BMI 35.0-39.9 without comorbidity)   ?5. Depo-Provera contraceptive status   ? ?  ?Plan:  ?Blood tests: CBC with diff, Comprehensive metabolic panel, and Lipoproteins. ?Breast self exam technique reviewed and patient encouraged to perform self-exam monthly. ?Contraception: Depo-Provera injections. ?Discussed healthy lifestyle modifications. ?Pap smear ordered.  ?COVID vaccination status: declined.  ?Follow up in 1 year for annual exam ? ? ?Rubie Maid, MD ?[facility]  ?

## 2021-06-14 NOTE — Patient Instructions (Signed)
Breast Self-Awareness ?Breast self-awareness is knowing how your breasts look and feel. You need to: ?Check your breasts on a regular basis. ?Tell your doctor about any changes. ?Become familiar with the look and feel of your breasts. This can help you catch a breast problem while it is still small and can be treated. You should do breast self-exams even if you have breast implants. ?What you need: ?A mirror. ?A well-lit room. ?A pillow or other soft object. ?How to do a breast self-exam ?Follow these steps to do a breast self-exam: ?Look for changes ? ?Take off all the clothes above your waist. ?Stand in front of a mirror in a room with good lighting. ?Put your hands down at your sides. ?Compare your breasts in the mirror. Look for any difference between them, such as: ?A difference in shape. ?A difference in size. ?Wrinkles, dips, and bumps in one breast and not the other. ?Look at each breast for changes in the skin, such as: ?Redness. ?Scaly areas. ?Skin that has gotten thicker. ?Dimpling. ?Open sores (ulcers). ?Look for changes in your nipples, such as: ?Fluid coming out of a nipple. ?Fluid around a nipple. ?Bleeding. ?Dimpling. ?Redness. ?A nipple that looks pushed in (retracted), or that has changed position. ?Feel for changes ?Lie on your back. ?Feel each breast. To do this: ?Pick a breast to feel. ?Place a pillow under the shoulder closest to that breast. Put the arm closest to that breast behind your head. ?Feel the nipple area of that breast using the hand of your other arm. Feel the area with the pads of your three middle fingers by making small circles with your fingers. Use light, medium, and firm pressure. ?Continue the overlapping circles, moving downward over the breast. Keep making circles with your fingers. Stop when you feel your ribs. ?Start making circles with your fingers again, this time going upward until you reach your collarbone. ?Then, make circles outward across your breast and into your  armpit area. ?Squeeze your nipple. Check for discharge and lumps. ?Repeat these steps to check your other breast. ?Sit or stand in the tub or shower. ?With soapy water on your skin, feel each breast the same way you did when you were lying down. ?Write down what you find ?Writing down what you find can help you remember what to tell your doctor. Write down: ?What is normal for each breast. ?Any changes you find in each breast. These include: ?The kind of changes you find. ?A tender or painful breast. ?Any lump you find. Write down its size and where it is. ?When you last had your monthly period (menstrual cycle). ?General tips ?If you are breastfeeding, the best time to check your breasts is after you feed your baby or after you use a breast pump. ?If you get monthly bleeding, the best time to check your breasts is 5-7 days after your monthly cycle ends. ?With time, you will become comfortable with the self-exam. You will also start to know if there are changes in your breasts. ?Contact a doctor if: ?You see a change in the shape or size of your breasts or nipples. ?You see a change in the skin of your breast or nipples, such as red or scaly skin. ?You have fluid coming from your nipples that is not normal. ?You find a new lump or thick area. ?You have breast pain. ?You have any concerns about your breast health. ?Summary ?Breast self-awareness includes looking for changes in your breasts and feeling for changes   within your breasts. ?You should do breast self-awareness in front of a mirror in a well-lit room. ?If you get monthly periods (menstrual cycles), the best time to check your breasts is 5-7 days after your period ends. ?Tell your doctor about any changes you see in your breasts. Changes include changes in size, changes on the skin, painful or tender breasts, or fluid from your nipples that is not normal. ?This information is not intended to replace advice given to you by your health care provider. Make sure  you discuss any questions you have with your health care provider. ?Document Revised: 11/26/2020 Document Reviewed: 11/26/2020 ?Elsevier Patient Education ? 2023 [employer] ?Preventive Care 21-39 Years Old, Female ?Preventive care refers to lifestyle choices and visits with your health care provider that can promote health and wellness. Preventive care visits are also called wellness exams. ?What can I expect for my preventive care visit? ?Counseling ?During your preventive care visit, your health care provider may ask about your: ?Medical history, including: ?Past medical problems. ?Family medical history. ?Pregnancy history. ?Current health, including: ?Menstrual cycle. ?Method of birth control. ?Emotional well-being. ?Home life and relationship well-being. ?Sexual activity and sexual health. ?Lifestyle, including: ?Alcohol, nicotine or tobacco, and drug use. ?Access to firearms. ?Diet, exercise, and sleep habits. ?Work and work environment. ?Sunscreen use. ?Safety issues such as seatbelt and bike helmet use. ?Physical exam ?Your health care provider may check your: ?Height and weight. These may be used to calculate your BMI (body mass index). BMI is a measurement that tells if you are at a healthy weight. ?Waist circumference. This measures the distance around your waistline. This measurement also tells if you are at a healthy weight and may help predict your risk of certain diseases, such as type 2 diabetes and high blood pressure. ?Heart rate and blood pressure. ?Body temperature. ?Skin for abnormal spots. ?What immunizations do I need? ? ?Vaccines are usually given at various ages, according to a schedule. Your health care provider will recommend vaccines for you based on your age, medical history, and lifestyle or other factors, such as travel or where you work. ?What tests do I need? ?Screening ?Your health care provider may recommend screening tests for certain conditions. This may include: ?Pelvic exam  and Pap test. ?Lipid and cholesterol levels. ?Diabetes screening. This is done by checking your blood sugar (glucose) after you have not eaten for a while (fasting). ?Hepatitis B test. ?Hepatitis C test. ?HIV (human immunodeficiency virus) test. ?STI (sexually transmitted infection) testing, if you are at risk. ?BRCA-related cancer screening. This may be done if you have a family history of breast, ovarian, tubal, or peritoneal cancers. ?Talk with your health care provider about your test results, treatment options, and if necessary, the need for more tests. ?Follow these instructions at home: ?Eating and drinking ? ?Eat a healthy diet that includes fresh fruits and vegetables, whole grains, lean protein, and low-fat dairy products. ?Take vitamin and mineral supplements as recommended by your health care provider. ?Do not drink alcohol if: ?Your health care provider tells you not to drink. ?You are pregnant, may be pregnant, or are planning to become pregnant. ?If you drink alcohol: ?Limit how much you have to 0-1 drink a day. ?Know how much alcohol is in your drink. In the [place] one drink equals one 12 oz bottle of beer (355 mL), one 5 oz glass of wine (148 mL), or one 1? oz glass of hard liquor (44 mL). ?Lifestyle ?Brush your teeth every morning and   night with fluoride toothpaste. Floss one time each day. ?Exercise for at least 30 minutes 5 or more days each week. ?Do not use any products that contain nicotine or tobacco. These products include cigarettes, chewing tobacco, and vaping devices, such as e-cigarettes. If you need help quitting, ask your health care provider. ?Do not use drugs. ?If you are sexually active, practice safe sex. Use a condom or other form of protection to prevent STIs. ?If you do not wish to become pregnant, use a form of birth control. If you plan to become pregnant, see your health care provider for a prepregnancy visit. ?Find healthy ways to manage stress, such as: ?Meditation, yoga, or  listening to music. ?Journaling. ?Talking to a trusted person. ?Spending time with friends and family. ?Minimize exposure to UV radiation to reduce your risk of skin cancer. ?Safety ?Always wear you

## 2021-06-15 ENCOUNTER — Encounter: Payer: Self-pay | Admitting: Obstetrics and Gynecology

## 2021-06-15 ENCOUNTER — Other Ambulatory Visit (HOSPITAL_COMMUNITY)
Admission: RE | Admit: 2021-06-15 | Discharge: 2021-06-15 | Disposition: A | Discharge status: Home / Self Care | Payer: BC Managed Care – PPO | Source: Ambulatory Visit | Attending: Obstetrics and Gynecology | Admitting: Obstetrics and Gynecology

## 2021-06-15 ENCOUNTER — Ambulatory Visit (INDEPENDENT_AMBULATORY_CARE_PROVIDER_SITE_OTHER): Payer: BC Managed Care – PPO | Admitting: Obstetrics and Gynecology

## 2021-06-15 VITALS — BP 136/77 | HR 83 | Resp 16 | Ht 64.0 in | Wt 217.3 lb

## 2021-06-15 DIAGNOSIS — E669 Obesity, unspecified: Secondary | ICD-10-CM

## 2021-06-15 DIAGNOSIS — Z13220 Encounter for screening for lipoid disorders: Secondary | ICD-10-CM | POA: Diagnosis not present

## 2021-06-15 DIAGNOSIS — Z124 Encounter for screening for malignant neoplasm of cervix: Secondary | ICD-10-CM | POA: Insufficient documentation

## 2021-06-15 DIAGNOSIS — Z3042 Encounter for surveillance of injectable contraceptive: Secondary | ICD-10-CM

## 2021-06-15 DIAGNOSIS — Z01411 Encounter for gynecological examination (general) (routine) with abnormal findings: Secondary | ICD-10-CM

## 2021-06-15 DIAGNOSIS — Z01419 Encounter for gynecological examination (general) (routine) without abnormal findings: Secondary | ICD-10-CM

## 2021-06-15 DIAGNOSIS — Z6835 Body mass index (BMI) 35.0-35.9, adult: Secondary | ICD-10-CM | POA: Diagnosis not present

## 2021-06-16 LAB — CBC
Hematocrit: 36.4 % (ref 34.0–46.6)
Hemoglobin: 11.3 g/dL (ref 11.1–15.9)
MCH: 24.8 pg — ABNORMAL LOW (ref 26.6–33.0)
MCHC: 31 g/dL — ABNORMAL LOW (ref 31.5–35.7)
MCV: 80 fL (ref 79–97)
Platelets: 340 10*3/uL (ref 150–450)
RBC: 4.55 x10E6/uL (ref 3.77–5.28)
RDW: 15.7 % — ABNORMAL HIGH (ref 11.7–15.4)
WBC: 5.4 10*3/uL (ref 3.4–10.8)

## 2021-06-16 LAB — COMPREHENSIVE METABOLIC PANEL
ALT: 13 IU/L (ref 0–32)
AST: 16 IU/L (ref 0–40)
Albumin/Globulin Ratio: 1.2 (ref 1.2–2.2)
Albumin: 4.2 g/dL (ref 3.9–5.0)
Alkaline Phosphatase: 67 IU/L (ref 44–121)
BUN/Creatinine Ratio: 8 — ABNORMAL LOW (ref 9–23)
BUN: 6 mg/dL (ref 6–20)
Bilirubin Total: 0.3 mg/dL (ref 0.0–1.2)
CO2: 20 mmol/L (ref 20–29)
Calcium: 9.2 mg/dL (ref 8.7–10.2)
Chloride: 105 mmol/L (ref 96–106)
Creatinine, Ser: 0.74 mg/dL (ref 0.57–1.00)
Globulin, Total: 3.6 g/dL (ref 1.5–4.5)
Glucose: 77 mg/dL (ref 70–99)
Potassium: 4.4 mmol/L (ref 3.5–5.2)
Sodium: 142 mmol/L (ref 134–144)
Total Protein: 7.8 g/dL (ref 6.0–8.5)
eGFR: 112 mL/min/{1.73_m2} (ref >59)

## 2021-06-16 LAB — LIPID PANEL
Chol/HDL Ratio: 2.7 ratio (ref 0.0–4.4)
Cholesterol, Total: 140 mg/dL (ref 100–199)
HDL: 52 mg/dL (ref >39)
LDL Chol Calc (NIH): 75 mg/dL (ref 0–99)
Triglycerides: 66 mg/dL (ref 0–149)
VLDL Cholesterol Cal: 13 mg/dL (ref 5–40)

## 2021-06-17 LAB — CYTOLOGY - PAP: Diagnosis: NEGATIVE

## 2021-07-08 ENCOUNTER — Ambulatory Visit: Payer: Self-pay | Admitting: *Deleted

## 2021-07-08 NOTE — Telephone Encounter (Signed)
Message from Randol Kern sent at 07/08/2021  1:16 PM EDT  Summary: New patient request, needs nurse advice   Pt believes she has a sinus infection, says she cannot hear out of right ear. Called [facility], declined next available new patient appt.          Reason for Disposition  Ear congestion    No triage completed.   See notes  Answer Assessment - Initial Assessment Questions 1. LOCATION: "Which ear is involved?"       Pt called into [facility] to establish as a new pt and and to be seen for possible sinus infection and not being able to hear out of one of her ears due to congestion. The next availability for new pt appt wasn't until Dec. 2023.   She wants to be seen sooner than that even to be established as a new pt.  I offered her [facility] via MyChart.   She was agreeable to doing this however she was a work and will need to schedule later after she gets off.   I offered to help her get scheduled for the virtual visit but she said she needed to go because she was at work.    I let her know if she had any problem with getting scheduled for the virtual visit to call back and someone could help her.   She was agreeable to this plan.   I recommended she could go to an urgent care too.  Triage not continued at this point.     2. SENSATION: "Describe how the ear feels." (e.g. stuffy, full, plugged)."      *No Answer* 3. ONSET:  "When did the ear symptoms start?"       *No Answer* 4. PAIN: "Do you also have an earache?" If Yes, ask: "How bad is it?" (Scale 1-10; or mild, moderate, severe)     *No Answer* 5. CAUSE: "What do you think is causing the ear congestion?"     *No Answer* 6. URI: "Do you have a runny nose or cough?"      *No Answer* 7. NASAL ALLERGIES: "Are there symptoms of hay fever, such as sneezing or a clear nasal discharge?"     *No Answer* 8. PREGNANCY: "Is there any chance you are pregnant?" "When was your last menstrual period?"     *No  Answer*  Protocols used: Ear - Congestion-A-AH

## 2021-07-09 ENCOUNTER — Telehealth: Payer: BC Managed Care – PPO | Admitting: Family Medicine

## 2021-07-09 ENCOUNTER — Telehealth: Payer: BC Managed Care – PPO

## 2021-07-09 DIAGNOSIS — J019 Acute sinusitis, unspecified: Secondary | ICD-10-CM

## 2021-07-09 DIAGNOSIS — B9689 Other specified bacterial agents as the cause of diseases classified elsewhere: Secondary | ICD-10-CM | POA: Diagnosis not present

## 2021-07-09 MED ORDER — FLUTICASONE PROPIONATE 50 MCG/ACT NA SUSP: 2.0000 | Freq: Every day | NASAL | 0 refills | Status: AC

## 2021-07-09 MED ORDER — AMOXICILLIN-POT CLAVULANATE 875-125 MG PO TABS: 1.0000 | ORAL_TABLET | Freq: Two times a day (BID) | ORAL | 0 refills | Status: AC

## 2021-07-09 NOTE — Patient Instructions (Signed)

## 2021-07-09 NOTE — Progress Notes (Signed)
Virtual Visit Consent   Amy Oconnor, you are scheduled for a virtual visit with a [facility] provider today. Just as with appointments in the office, your consent must be obtained to participate. Your consent will be active for this visit and any virtual visit you may have with one of our providers in the next 365 days. If you have a MyChart account, a copy of this consent can be sent to you electronically.  As this is a virtual visit, video technology does not allow for your provider to perform a traditional examination. This may limit your provider's ability to fully assess your condition. If your provider identifies any concerns that need to be evaluated in person or the need to arrange testing (such as labs, EKG, etc.), we will make arrangements to do so. Although advances in technology are sophisticated, we cannot ensure that it will always work on either your end or our end. If the connection with a video visit is poor, the visit may have to be switched to a telephone visit. With either a video or telephone visit, we are not always able to ensure that we have a secure connection.  By engaging in this virtual visit, you consent to the provision of healthcare and authorize for your insurance to be billed (if applicable) for the services provided during this visit. Depending on your insurance coverage, you may receive a charge related to this service.  I need to obtain your verbal consent now. Are you willing to proceed with your visit today? Amy Oconnor has provided verbal consent on 07/09/2021 for a virtual visit (video or telephone). Freddy Finner, NP  Date: 07/09/2021 8:11 AM  Virtual Visit via Video Note   I, Freddy Finner, connected with  Amy Oconnor  ([id number], 09-Mar-1990) on 07/09/21 at  8:00 AM EDT by a video-enabled telemedicine application and verified that I am speaking with the correct person using two identifiers.  Location: Patient: Virtual Visit Location Patient:  Home Provider: Virtual Visit Location Provider: Home Office   I discussed the limitations of evaluation and management by telemedicine and the availability of in person appointments. The patient expressed understanding and agreed to proceed.    History of Present Illness: Amy Oconnor is a 31 y.o. who identifies as a female who was assigned female at birth, and is being seen today for sinus infection and ear pain. Saturday onset- started with a fever broke overnight with tylenol, had some on going nasal pressure. Wednesday night reports hearing went out- muffled hearing in right ear with discomfort. Last night throat started hurting and developed soreness and hoarseness. Mild cough related to drainage. Ongoing nasal congestion. No longer breastfeeding. Not pregnant. Denies shortness of breath, chest pain.   Problems:  Patient Active Problem List   Diagnosis Date Noted   Post-dates pregnancy 01/11/2021   Obesity in pregnancy, antepartum 01/09/2021    Allergies: No Known Allergies Medications:  Current Outpatient Medications:    medroxyPROGESTERone (DEPO-PROVERA) 150 MG/ML injection, Inject 1 mL (150 mg total) into the muscle every 3 (three) months., Disp: 1 mL, Rfl: 3  Observations/Objective: Patient is well-developed, well-nourished in no acute distress.  Resting comfortably  at home.  Head is normocephalic, atraumatic.  No labored breathing.  Speech is clear and coherent with logical content.  Patient is alert and oriented at baseline.  Nasal tone and hoarseness noted  Assessment and Plan: 1. Acute bacterial sinusitis  - amoxicillin-clavulanate (AUGMENTIN) 875-125 MG tablet; Take 1 tablet by mouth 2 (two)  times daily for 7 days.  Dispense: 14 tablet; Refill: 0 - fluticasone (FLONASE) 50 MCG/ACT nasal spray; Place 2 sprays into both nostrils daily.  Dispense: 16 g; Refill: 0  S&S consistent with sinus infection  Rest, hydration, and treatment as per above Strict in person  follow up if ear pain worsens, does not improve with treatment.   Reviewed side effects, risks and benefits of medication.    Patient acknowledged agreement and understanding of the plan.   Past Medical, Surgical, Social History, Allergies, and Medications have been Reviewed.   Follow Up Instructions: I discussed the assessment and treatment plan with the patient. The patient was provided an opportunity to ask questions and all were answered. The patient agreed with the plan and demonstrated an understanding of the instructions.  A copy of instructions were sent to the patient via MyChart unless otherwise noted below.    The patient was advised to call back or seek an in-person evaluation if the symptoms worsen or if the condition fails to improve as anticipated.  Time:  I spent 11 minutes with the patient via telehealth technology discussing the above problems/concerns.    Freddy Finner, NP

## 2021-08-16 ENCOUNTER — Ambulatory Visit (INDEPENDENT_AMBULATORY_CARE_PROVIDER_SITE_OTHER): Payer: BC Managed Care – PPO | Admitting: Obstetrics and Gynecology

## 2021-08-16 VITALS — BP 130/83 | HR 74 | Resp 16 | Ht 64.0 in | Wt 217.0 lb

## 2021-08-16 DIAGNOSIS — Z3042 Encounter for surveillance of injectable contraceptive: Secondary | ICD-10-CM | POA: Diagnosis not present

## 2021-08-16 MED ORDER — MEDROXYPROGESTERONE ACETATE 150 MG/ML IM SUSP
150.0000 mg | Freq: Once | INTRAMUSCULAR | Status: AC
  Administered 2021-08-16: 150 mg via INTRAMUSCULAR

## 2021-08-16 NOTE — Patient Instructions (Signed)
Medroxyprogesterone Injection (Contraception) ?What is this medication? ?MEDROXYPROGESTERONE (me DROX ee proe JES te rone) prevents ovulation and pregnancy. It belongs to a group of medications called contraceptives. This medication is a progestin hormone. ?This medicine may be used for other purposes; ask your health care provider or pharmacist if you have questions. ?COMMON BRAND NAME(S): Depo-Provera, Depo-subQ Provera 104 ?What should I tell my care team before I take this medication? ?They need to know if you have any of these conditions: ?Asthma ?Blood clots ?Breast cancer or family history of breast cancer ?Depression ?Diabetes ?Eating disorder (anorexia nervosa) ?Heart attack ?High blood pressure ?HIV infection or AIDS ?If you often drink alcohol ?Kidney disease ?Liver disease ?Migraine headaches ?Osteoporosis, weak bones ?Seizures ?Stroke ?Tobacco smoker ?Vaginal bleeding ?An unusual or allergic reaction to medroxyprogesterone, other hormones, medications, foods, dyes, or preservatives ?Pregnant or trying to get pregnant ?Breast-feeding ?How should I use this medication? ?Depo-Provera CI contraceptive injection is given into a muscle. Depo-subQ Provera 104 injection is given under the skin. It is given in a hospital or clinic setting. The injection is usually given during the first 5 days after the start of a menstrual period or 6 weeks after delivery of a baby. ?A patient package insert for the product will be given with each prescription and refill. Be sure to read this information carefully each time. The sheet may change often. ?Talk to your care team about the use of this medication in children. Special care may be needed. These injections have been used in female children who have started having menstrual periods. ?Overdosage: If you think you have taken too much of this medicine contact a poison control center or emergency room at once. ?NOTE: This medicine is only for you. Do not share this medicine  with others. ?What if I miss a dose? ?Keep appointments for follow-up doses. You must get an injection once every 3 months. It is important not to miss your dose. Call your care team if you are unable to keep an appointment. ?What may interact with this medication? ?Antibiotics or medications for infections, especially rifampin and griseofulvin ?Antivirals for HIV or hepatitis ?Aprepitant ?Armodafinil ?Bexarotene ?Bosentan ?Medications for seizures like carbamazepine, felbamate, oxcarbazepine, phenytoin, phenobarbital, primidone, topiramate ?Mitotane ?Modafinil ?St. John's wort ?This list may not describe all possible interactions. Give your health care provider a list of all the medicines, herbs, non-prescription drugs, or dietary supplements you use. Also tell them if you smoke, drink alcohol, or use illegal drugs. Some items may interact with your medicine. ?What should I watch for while using this medication? ?This medication does not protect you against HIV infection (AIDS) or other sexually transmitted diseases. ?Use of this product may cause you to lose calcium from your bones. Loss of calcium may cause weak bones (osteoporosis). Only use this product for more than 2 years if other forms of birth control are not right for you. The longer you use this product for birth control the more likely you will be at risk for weak bones. Ask your care team how you can keep strong bones. ?You may have a change in bleeding pattern or irregular periods. Many females stop having periods while taking this medication. ?If you have received your injections on time, your chance of being pregnant is very low. If you think you may be pregnant, see your care team as soon as possible. ?Tell your care team if you want to get pregnant within the next year. The effect of this medication may last a   long time after you get your last injection. ?What side effects may I notice from receiving this medication? ?Side effects that you should  report to your care team as soon as possible: ?Allergic reactions--skin rash, itching, hives, swelling of the face, lips, tongue, or throat ?Blood clot--pain, swelling, or warmth in the leg, shortness of breath, chest pain ?Gallbladder problems--severe stomach pain, nausea, vomiting, fever ?Increase in blood pressure ?Liver injury--right upper belly pain, loss of appetite, nausea, light-colored stool, dark yellow or brown urine, yellowing skin or eyes, unusual weakness or fatigue ?New or worsening migraines or headaches ?Seizures ?Stroke--sudden numbness or weakness of the face, arm, or leg, trouble speaking, confusion, trouble walking, loss of balance or coordination, dizziness, severe headache, change in vision ?Unusual vaginal discharge, itching, or odor ?Worsening mood, feelings of depression ?Side effects that usually do not require medical attention (report to your care team if they continue or are bothersome): ?Breast pain or tenderness ?Dark patches of the skin on the face or other sun-exposed areas ?Irregular menstrual cycles or spotting ?Nausea ?Weight gain ?This list may not describe all possible side effects. Call your doctor for medical advice about side effects. You may report side effects to FDA at 1-800-FDA-1088. ?Where should I keep my medication? ?This injection is only given by a care team. It will not be stored at home. ?NOTE: This sheet is a summary. It may not cover all possible information. If you have questions about this medicine, talk to your doctor, pharmacist, or health care provider. ?? 2023 Elsevier/Gold Standard (2020-03-29 00:00:00) ? ?

## 2021-08-16 NOTE — Progress Notes (Unsigned)
Date last pap: n/a, scheduled for 06/15/21. Last Depo-Provera: 03/01/21. Side Effects if any: n/a. Serum HCG indicated? N/a. Depo-Provera 150 mg IM given by: Santiago Bumpers, CMA Next appointment due: September 25 - October 9

## 2021-08-18 ENCOUNTER — Encounter: Payer: Self-pay | Admitting: Obstetrics and Gynecology

## 2021-11-12 ENCOUNTER — Ambulatory Visit: Payer: BC Managed Care – PPO

## 2021-11-12 ENCOUNTER — Ambulatory Visit (INDEPENDENT_AMBULATORY_CARE_PROVIDER_SITE_OTHER): Payer: BC Managed Care – PPO

## 2021-11-12 VITALS — Ht 64.0 in | Wt 220.0 lb

## 2021-11-12 DIAGNOSIS — Z3042 Encounter for surveillance of injectable contraceptive: Secondary | ICD-10-CM | POA: Diagnosis not present

## 2021-11-12 MED ORDER — MEDROXYPROGESTERONE ACETATE 150 MG/ML IM SUSP
150.0000 mg | Freq: Once | INTRAMUSCULAR | Status: AC
  Administered 2021-11-12: 150 mg via INTRAMUSCULAR

## 2021-11-12 NOTE — Progress Notes (Addendum)
Date last pap: 06/15/2021. Last Depo-Provera: 08/16/2021. Side Effects if any: None. Serum HCG indicated? N/A. Depo-Provera 150 mg IM given by: Drenda Freeze, CMA. Next appointment due Dec 22-Jan 5.

## 2021-11-19 ENCOUNTER — Encounter: Payer: Self-pay | Admitting: Obstetrics and Gynecology

## 2022-01-10 ENCOUNTER — Other Ambulatory Visit (HOSPITAL_COMMUNITY)
Admission: RE | Admit: 2022-01-10 | Discharge: 2022-01-10 | Disposition: A | Discharge status: Home / Self Care | Payer: BC Managed Care – PPO | Source: Ambulatory Visit | Attending: Obstetrics & Gynecology | Admitting: Obstetrics & Gynecology

## 2022-01-10 ENCOUNTER — Ambulatory Visit (INDEPENDENT_AMBULATORY_CARE_PROVIDER_SITE_OTHER): Payer: BC Managed Care – PPO

## 2022-01-10 VITALS — BP 134/89 | HR 83 | Resp 16 | Wt 221.8 lb

## 2022-01-10 DIAGNOSIS — N760 Acute vaginitis: Secondary | ICD-10-CM

## 2022-01-10 DIAGNOSIS — N898 Other specified noninflammatory disorders of vagina: Secondary | ICD-10-CM | POA: Insufficient documentation

## 2022-01-10 NOTE — Progress Notes (Signed)
Subjective:     Amy Oconnor is a 31 y.o. female who presents for evaluation of an abnormal vaginal discharge. Symptoms have been present for 3 weeks. Vaginal symptoms: none. Contraception: none. She denies abnormal bleeding, blisters, bumps, burning, dyspareunia, lesions, local irritation, odor, pain, post coital bleeding, tears, vulvar itching, and warts Sexually transmitted infection risk: very low risk of STD exposure. Menstrual flow: currently on Depo.  The following portions of the patient's history were reviewed and updated as appropriate: allergies and current medications.   Review of Systems Pertinent items are noted in HPI.    Objective:    No exam performed today,  Nurse visit only .    Assessment:    Vaginitis .    Plan:    Symptomatic local care discussed.

## 2022-01-10 NOTE — Patient Instructions (Signed)

## 2022-01-12 LAB — CERVICOVAGINAL ANCILLARY ONLY
Bacterial Vaginitis (gardnerella): NEGATIVE
Candida Glabrata: NEGATIVE
Candida Vaginitis: NEGATIVE
Comment: NEGATIVE
Comment: NEGATIVE
Comment: NEGATIVE
Comment: NEGATIVE
Trichomonas: NEGATIVE

## 2022-01-23 ENCOUNTER — Other Ambulatory Visit: Payer: Self-pay | Admitting: Obstetrics and Gynecology

## 2022-08-09 IMAGING — US US OB COMP LESS 14 WK
1 series · 15 of 18 positions shown · non-contrast
Comparison: None.

CLINICAL DATA: Dates

EXAM:
OBSTETRIC <14 WK ULTRASOUND
TECHNIQUE: Transabdominal ultrasound was performed for evaluation of the
gestation as well as the maternal uterus and adnexal regions.

[Series 1: us ob comp less 14 wk · 18 acquisitions, 15 frames shown]
[im 1/18]
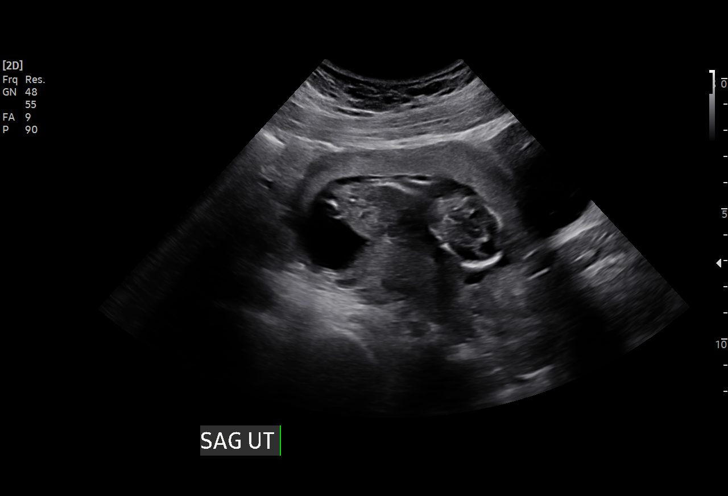
[im 2/18]
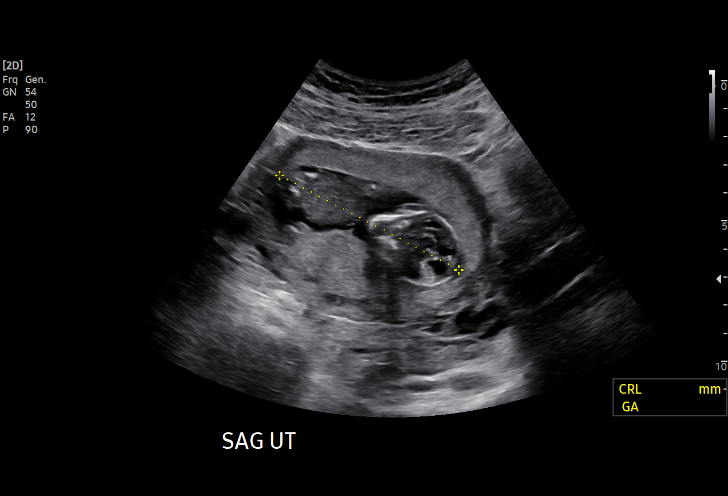
[im 4/18]
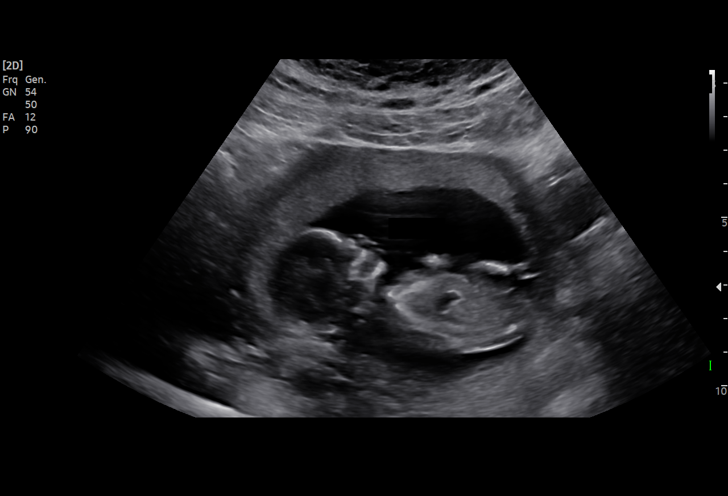
[im 5/18]
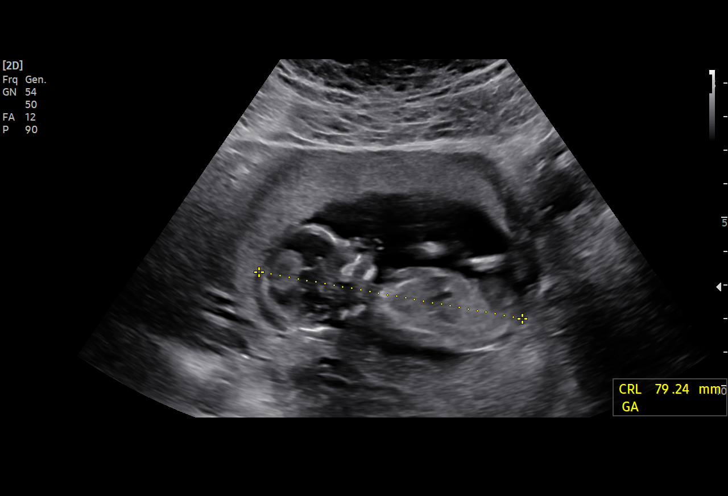
[im 6/18]
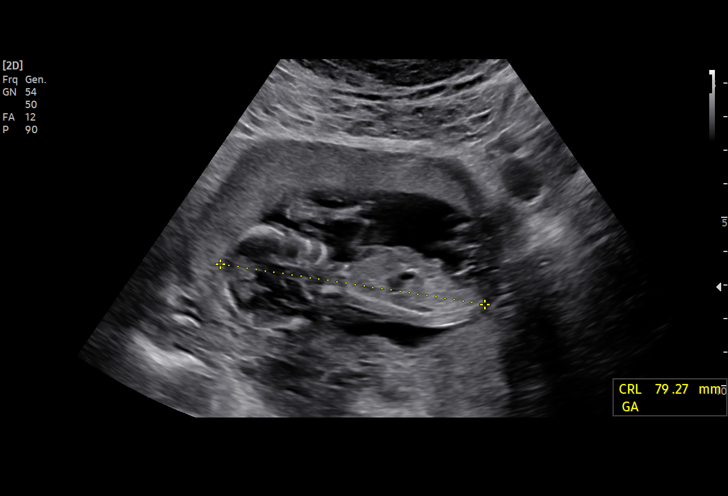
[im 7/18]
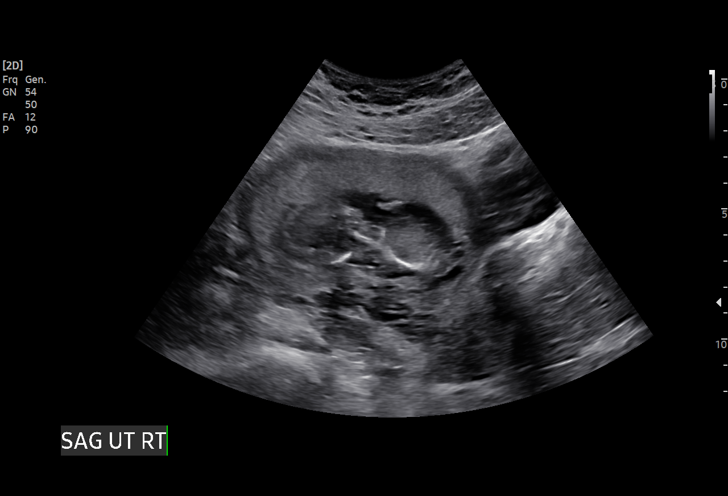
[im 8/18]
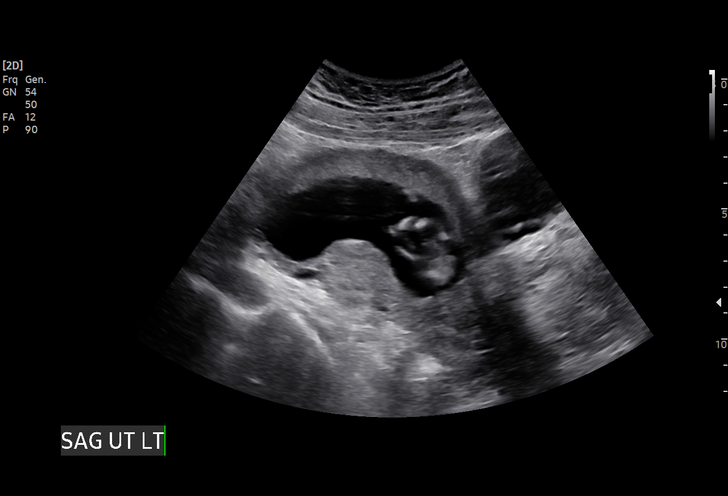
[im 10/18]
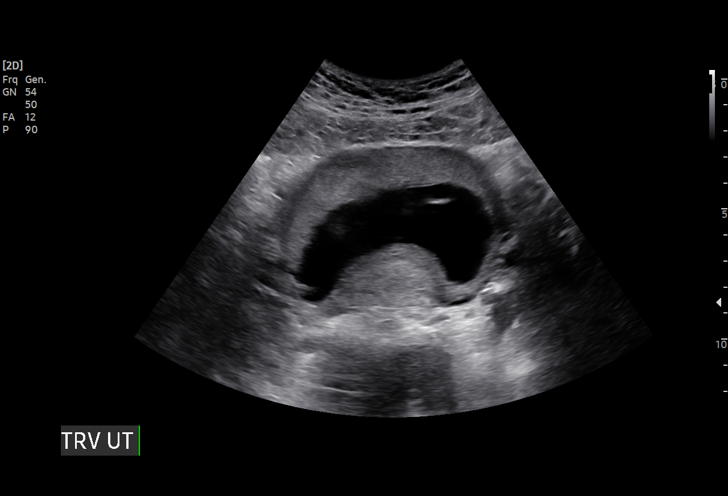
[im 11/18]
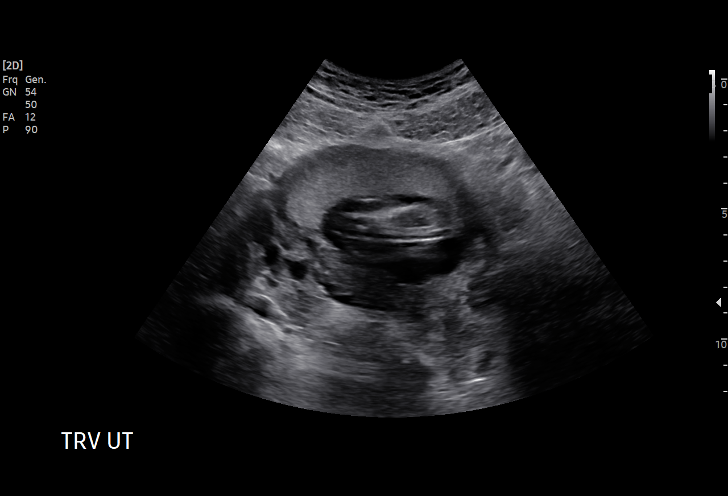
[im 12/18]
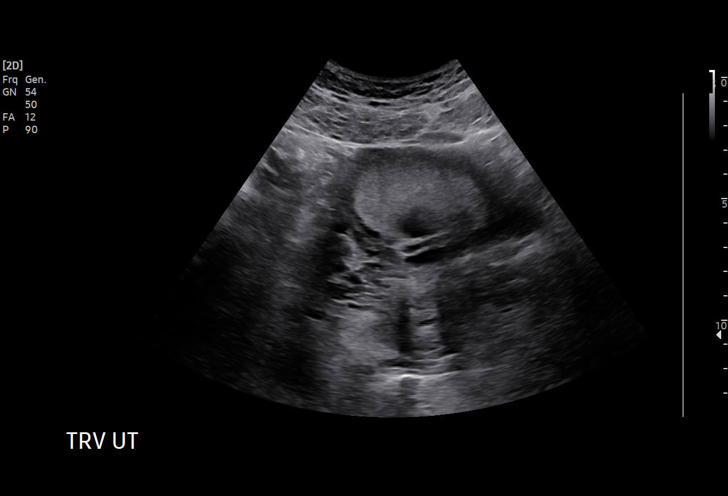
[im 13/18]
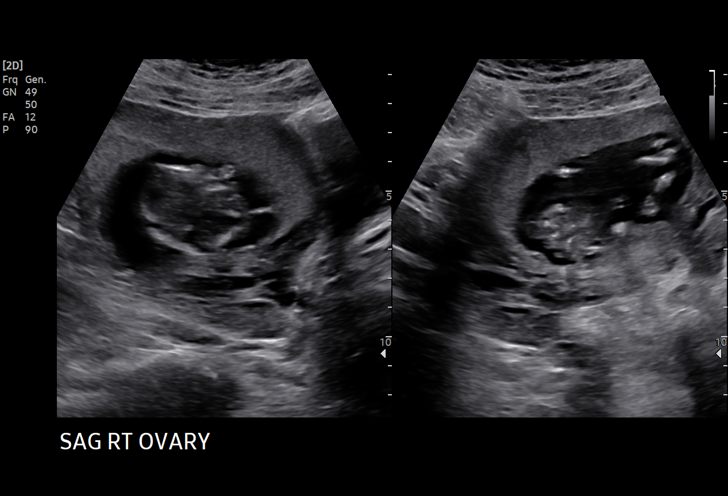
[im 14/18]
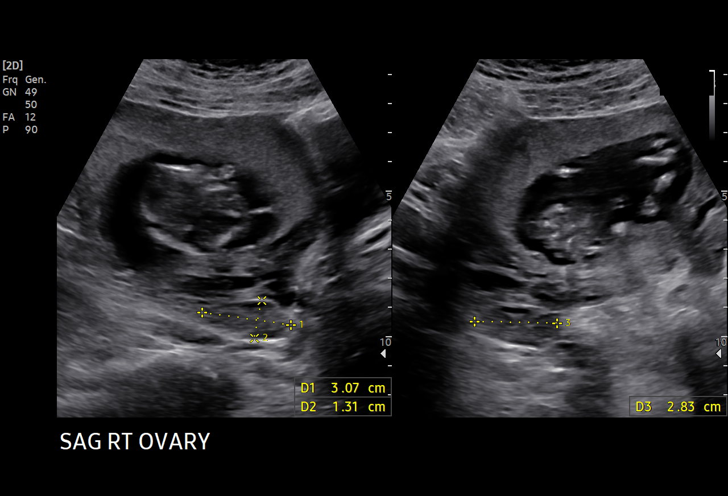
[im 16/18]
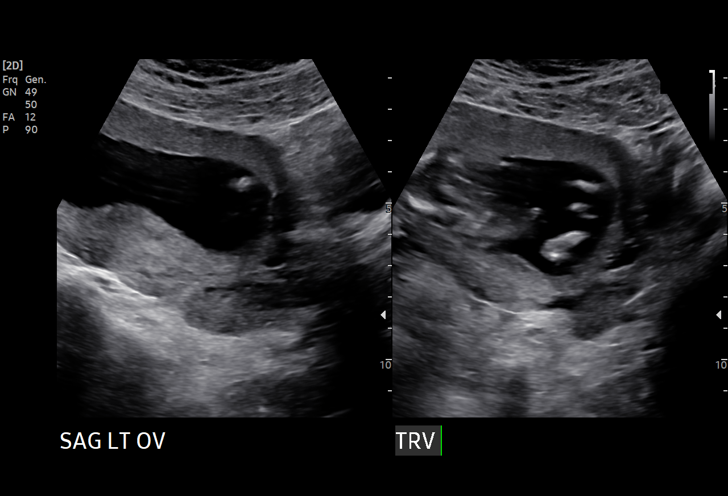
[im 17/18]
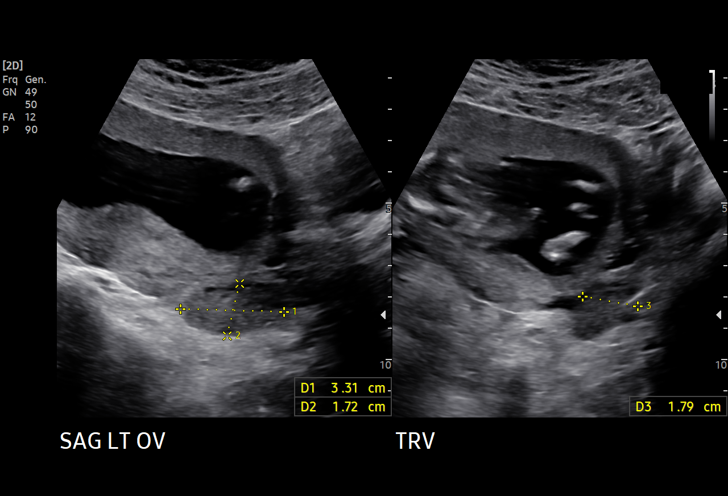
[im 18/18]
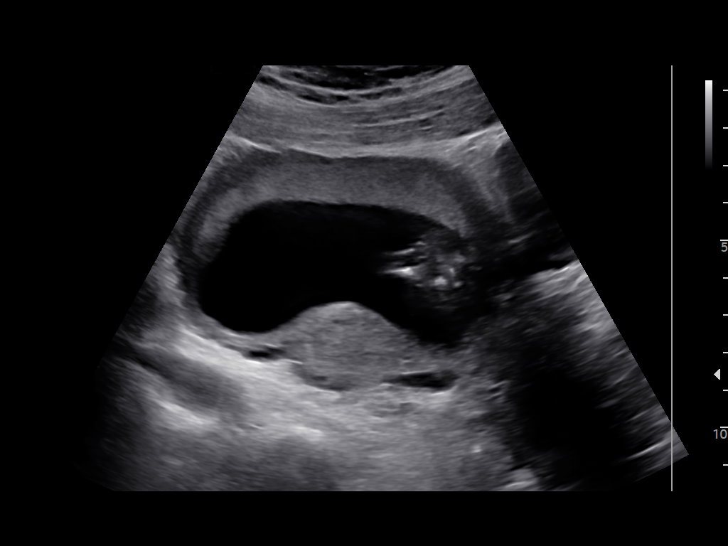

[15 of 18 positions shown; findings below may reference images not displayed]

FINDINGS: Intrauterine gestational sac: Single

Yolk sac:  Not visualized

Embryo:  Visualized

Cardiac Activity: Visualized

Heart Rate: 157 bpm

CRL: 76.8 mm   13 w 6 d                  US EDC: 01/07/2021

Subchorionic hemorrhage:  None visualized.

Maternal uterus/adnexae: Ovaries are within normal limits. Left
ovary measures 3.3 x 1.7 x 1.8 cm. The right ovary measures 3.1 x
1.3 x 2.8 cm. No significant free fluid
IMPRESSION: Single viable intrauterine pregnancy with estimated sonographic age
of 13 weeks 6 days and ultrasound EDC of 01/07/2021.

## 2022-09-14 IMAGING — US US OB COMP +14 WK
2 series · 13 of 28 positions shown · non-contrast
Comparison: none

CLINICAL DATA: Fetal anatomy evaluation

EXAM:
OBSTETRICAL ULTRASOUND >14 WKS

[Series 1: us ob comp + 14 wk · 12 of 89 slices shown]
[im 4/89]
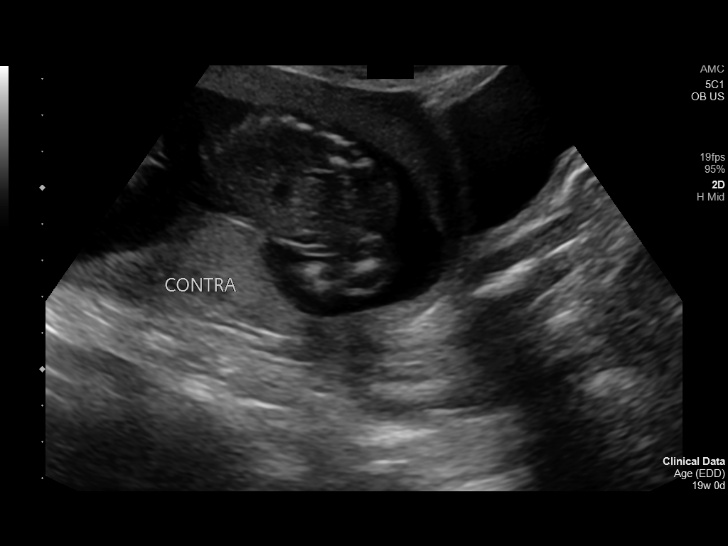
[im 11/89]
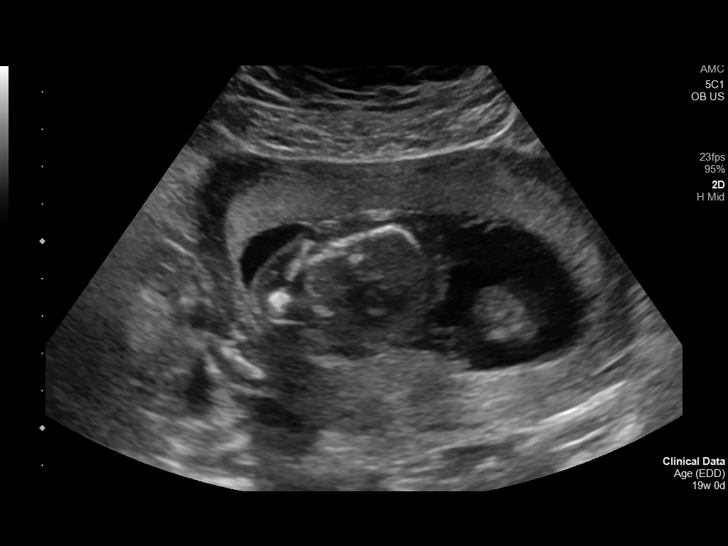
[im 18/89]
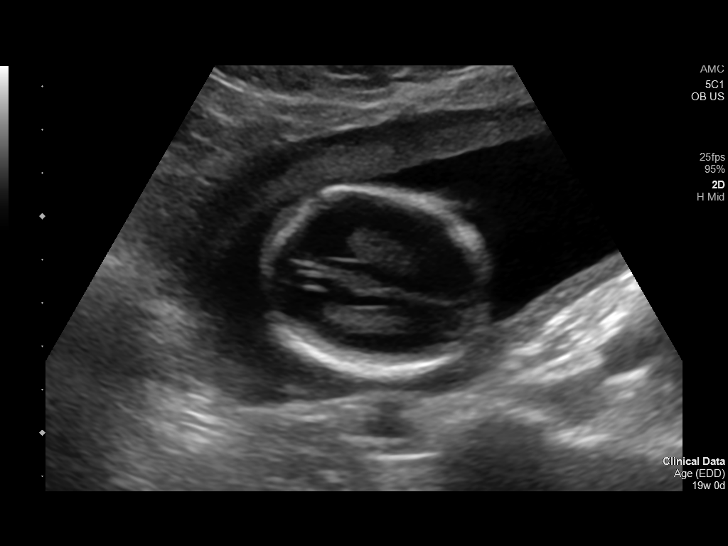
[im 25/89]
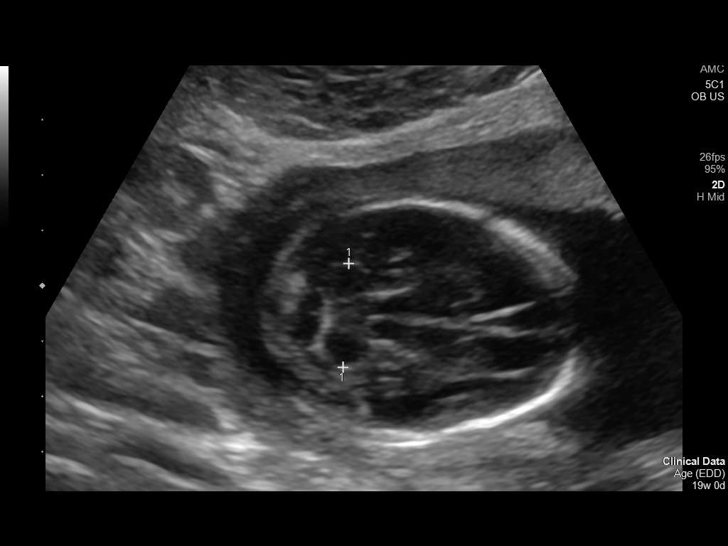
[im 32/89]
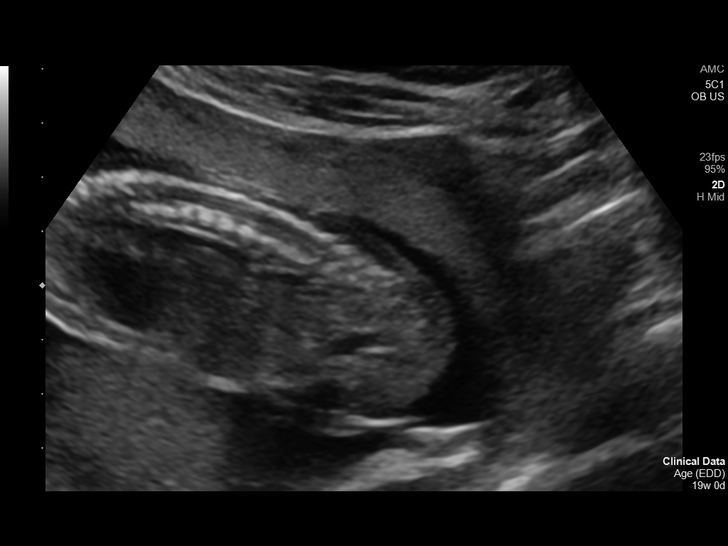
[im 39/89]
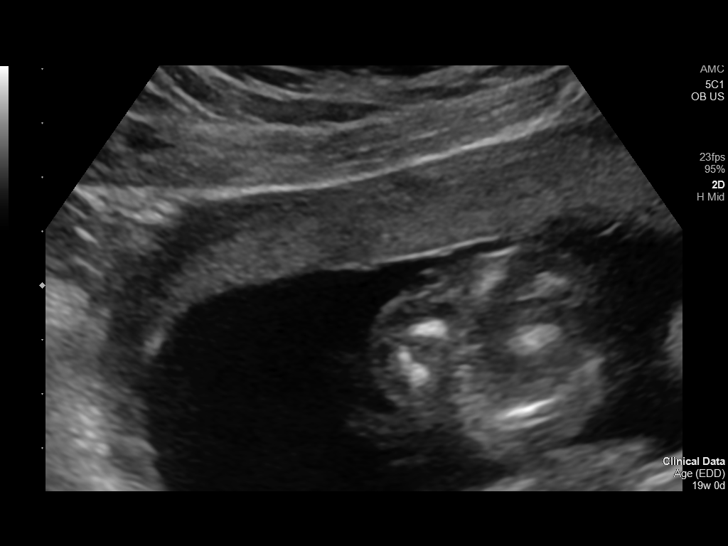
[im 50/89]
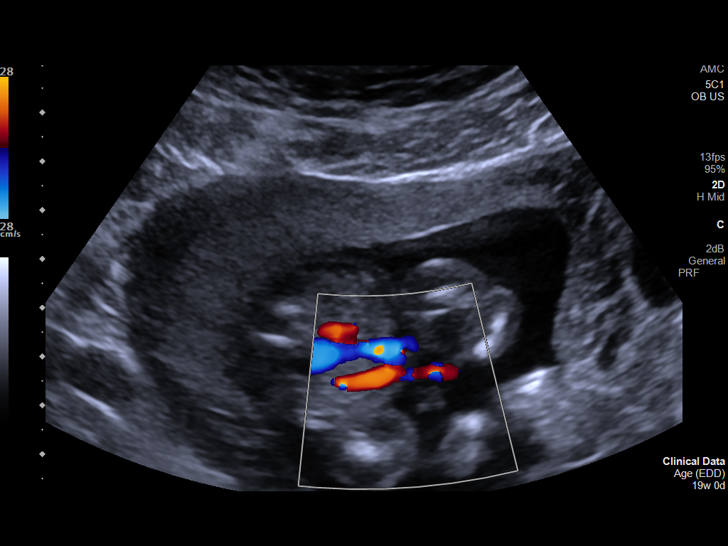
[im 57/89]
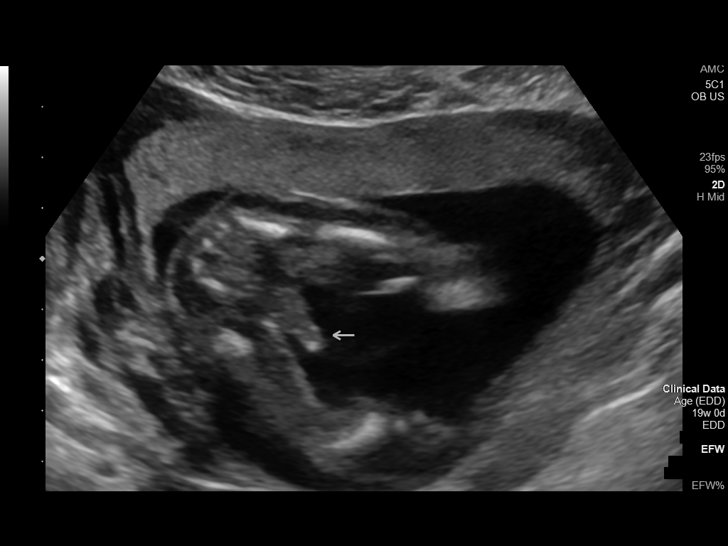
[im 64/89]
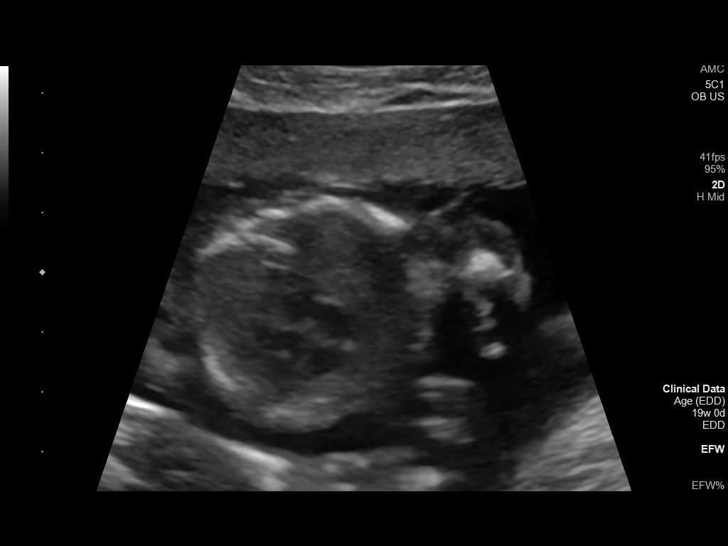
[im 71/89]
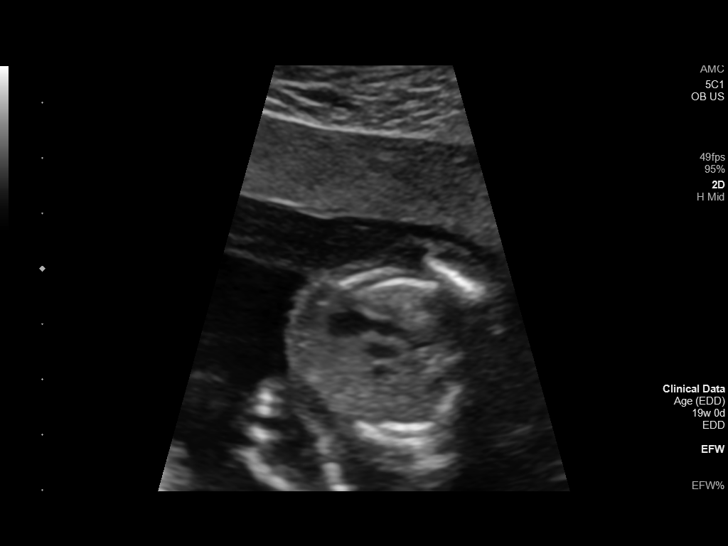
[im 78/89]
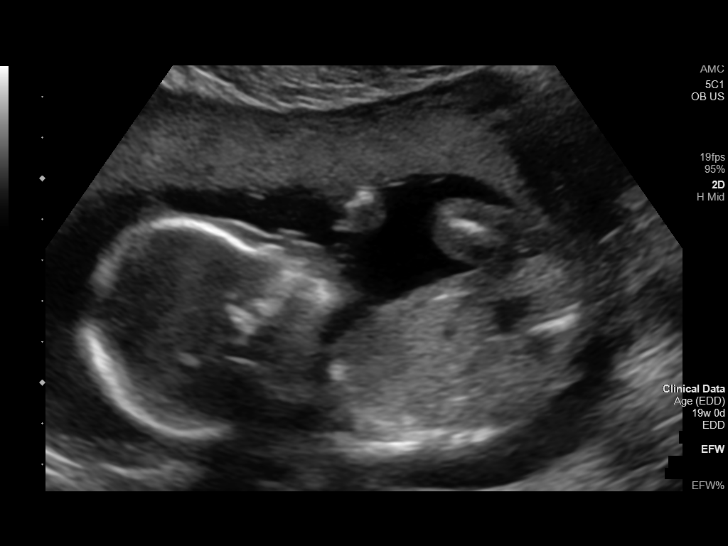
[im 85/89]
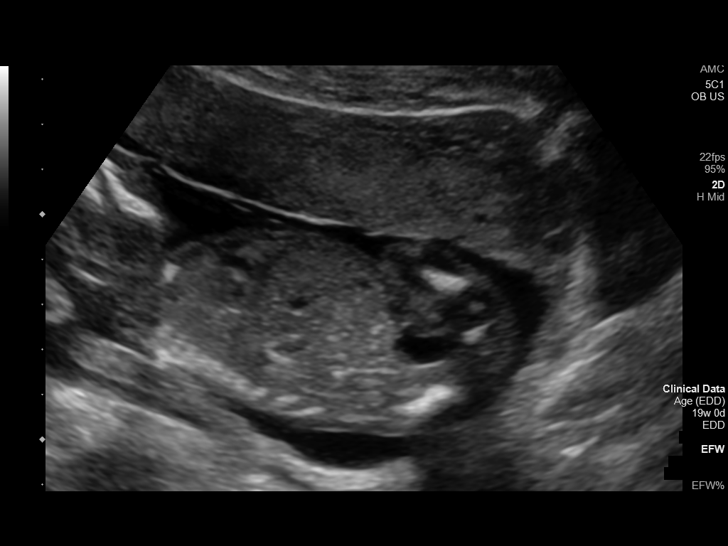

[Series 1001: ob us · 1 of 7 slices shown]
[im 1/7]
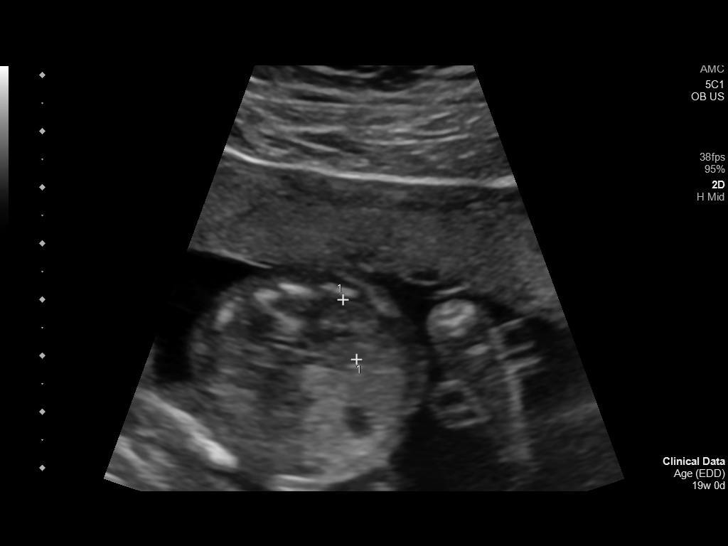

[13 of 28 positions shown; findings below may reference images not displayed]

FINDINGS: Number of Fetuses: 1

Heart Rate:  158 bpm

Movement: Yes

Presentation: Breech

Previa: No

Placental Location: Anterior

Amniotic Fluid (Subjective): Normal

Amniotic Fluid (Objective):

Vertical pocket = 3.8cm

FETAL BIOMETRY

BPD: 4.3cm 19w 0d

HC:   16cm  18w   6d

AC:   13cm  18w   4d

FL:   3cm  19w   1d

Current Mean GA: 18w 6d                     US EDC: 01/08/2021

Assigned GA:  19w 0d            Assigned EDC: 01/07/2021

FETAL ANATOMY

Lateral Ventricles: Appears normal

Thalami/CSP: Appears normal

Posterior Fossa:  Appears normal

Nuchal Region: Appears normal   NFT= 3 mm

Upper Lip: Appears normal

Spine: Appears normal

4 Chamber Heart on Left: Appears normal

LVOT: Appears normal

RVOT: Appears normal

Stomach on Left: Appears normal

3 Vessel Cord: Appears normal

Cord Insertion site: Appears normal

Kidneys: Appears normal

Bladder: Appears normal

Extremities: Appears normal

Sex: Male

Technically difficult due to: None

Maternal Findings:

Cervix:  Cervical length 3.9 cm
IMPRESSION: 1. Single live intrauterine pregnancy as detailed above.
2. No focal fetal anatomic abnormality.
